# Patient Record
Sex: Female | Born: 1945 | Race: White | Hispanic: No | Marital: Married | Smoking: Former smoker
Health system: Southern US, Community
[De-identification: ages and names within clinical notes are randomized; demographics above are authoritative.]

## PROBLEM LIST (undated history)

## (undated) DIAGNOSIS — M810 Age-related osteoporosis without current pathological fracture: Secondary | ICD-10-CM

## (undated) DIAGNOSIS — E7800 Pure hypercholesterolemia, unspecified: Secondary | ICD-10-CM

## (undated) DIAGNOSIS — U099 Post covid-19 condition, unspecified: Secondary | ICD-10-CM

## (undated) DIAGNOSIS — E039 Hypothyroidism, unspecified: Secondary | ICD-10-CM

## (undated) DIAGNOSIS — J45909 Unspecified asthma, uncomplicated: Secondary | ICD-10-CM

## (undated) DIAGNOSIS — M1990 Unspecified osteoarthritis, unspecified site: Secondary | ICD-10-CM

## (undated) DIAGNOSIS — J449 Chronic obstructive pulmonary disease, unspecified: Secondary | ICD-10-CM

## (undated) DIAGNOSIS — R053 Chronic cough: Secondary | ICD-10-CM

## (undated) DIAGNOSIS — U071 COVID-19: Secondary | ICD-10-CM

## (undated) DIAGNOSIS — S42309A Unspecified fracture of shaft of humerus, unspecified arm, initial encounter for closed fracture: Secondary | ICD-10-CM

## (undated) DIAGNOSIS — E079 Disorder of thyroid, unspecified: Secondary | ICD-10-CM

## (undated) DIAGNOSIS — Z87891 Personal history of nicotine dependence: Secondary | ICD-10-CM

## (undated) DIAGNOSIS — J189 Pneumonia, unspecified organism: Secondary | ICD-10-CM

## (undated) HISTORY — PX: TONSILLECTOMY: SUR1361

## (undated) HISTORY — PX: TUBAL LIGATION: SHX77

## (undated) HISTORY — PX: CATARACT EXTRACTION W/ INTRAOCULAR LENS IMPLANT: SHX1309

## (undated) HISTORY — PX: BACK SURGERY: SHX140

## (undated) HISTORY — PX: APPENDECTOMY: SHX54

---

## 2009-03-22 ENCOUNTER — Ambulatory Visit: Payer: Self-pay | Admitting: Orthopedic Surgery

## 2009-03-31 ENCOUNTER — Ambulatory Visit: Payer: Self-pay | Admitting: Orthopedic Surgery

## 2010-10-10 ENCOUNTER — Ambulatory Visit: Payer: Self-pay | Admitting: Family Medicine

## 2010-10-18 ENCOUNTER — Ambulatory Visit: Payer: Self-pay | Admitting: Family Medicine

## 2013-09-09 ENCOUNTER — Ambulatory Visit: Payer: Self-pay | Admitting: Ophthalmology

## 2013-09-24 ENCOUNTER — Ambulatory Visit: Payer: Self-pay | Admitting: Ophthalmology

## 2014-06-23 ENCOUNTER — Ambulatory Visit: Payer: Self-pay | Admitting: Otolaryngology

## 2014-07-07 ENCOUNTER — Ambulatory Visit: Payer: Self-pay | Admitting: Otolaryngology

## 2014-08-04 ENCOUNTER — Ambulatory Visit: Payer: Self-pay | Admitting: Gastroenterology

## 2014-08-07 LAB — PATHOLOGY REPORT

## 2015-04-02 NOTE — Op Note (Signed)
PATIENT NAME:  Elizabeth Bean, Elizabeth Bean MR#:  [id number] DATE OF BIRTH:  August 24, 1946  DATE OF PROCEDURE:  09/24/2013  PREOPERATIVE DIAGNOSIS: Visually significant cataract of the right eye.   POSTOPERATIVE DIAGNOSIS: Visually significant cataract of the right eye.   OPERATIVE PROCEDURE: Cataract extraction by phacoemulsification with implant of intraocular lens to the right eye.   SURGEON: Galen ManilaWilliam Stefano Trulson, MD  ANESTHESIA:  1. Managed anesthesia care.  2. 50-50 mixture of 0.75% bupivacaine and 4% Xylocaine given as a retrobulbar block.   COMPLICATIONS: None.   TECHNIQUE:  Stop and chop.  DESCRIPTION OF PROCEDURE: The patient was examined and consented for this procedure in the preoperative holding area and then brought back to the Operating Room where the anesthesia team employed managed anesthesia care.  3.5 milliliters of the aforementioned mixture were placed in the right orbit on an Atkinson needle without complication. The right eye was then prepped and draped in the usual sterile ophthalmic fashion. A lid speculum was placed. The side-port blade was used to create a paracentesis and the anterior chamber was filled with viscoelastic. The keratome was used to create a near clear corneal incision. The continuous curvilinear capsulorrhexis was performed with a cystotome followed by the capsulorrhexis forceps. Hydrodissection and hydrodelineation were carried out with BSS on a blunt cannula. The lens was removed in a stop and chop technique. The remaining cortical material was removed with the irrigation-aspiration handpiece. The capsular bag was inflated with viscoelastic and the Tecnis ZCB00 21.5-diopter lens, serial number [id number] was placed in the capsular bag without complication. The remaining viscoelastic was removed from the eye with the irrigation-aspiration handpiece. The wounds were hydrated. The anterior chamber was flushed with Miostat and the eye was inflated to a physiologic pressure.  0.1 mL of cefuroxime concentration 10 mg/mL was placed in the anterior chamber. The wounds were found to be water tight. The eye was dressed with Vigamox followed by Maxitrol ointment and a protective shield was placed. The patient will follow up with me in one day.    ____________________________ Jerilee FieldWilliam L. Alvester Eads, MD wlp:dmm D: 09/24/2013 17:13:27 ET T: 09/24/2013 20:00:57 ET JOB#: [id number]  cc: Lance Galas L. Belissa Kooy, MD, <Dictator> Jerilee FieldWILLIAM L Ishaaq Penna MD ELECTRONICALLY SIGNED 09/29/2013 13:19

## 2015-04-12 ENCOUNTER — Other Ambulatory Visit: Payer: Self-pay | Admitting: Internal Medicine

## 2015-04-12 DIAGNOSIS — Z1231 Encounter for screening mammogram for malignant neoplasm of breast: Secondary | ICD-10-CM

## 2015-04-28 ENCOUNTER — Other Ambulatory Visit: Payer: Self-pay | Admitting: Internal Medicine

## 2015-04-28 ENCOUNTER — Ambulatory Visit
Admission: RE | Admit: 2015-04-28 | Discharge: 2015-04-28 | Disposition: A | Discharge status: Home / Self Care | Payer: Medicare Other | Source: Ambulatory Visit | Attending: Internal Medicine | Admitting: Internal Medicine

## 2015-04-28 DIAGNOSIS — Z1231 Encounter for screening mammogram for malignant neoplasm of breast: Secondary | ICD-10-CM | POA: Diagnosis not present

## 2015-10-14 ENCOUNTER — Other Ambulatory Visit: Payer: Self-pay | Admitting: Internal Medicine

## 2015-10-14 DIAGNOSIS — R109 Unspecified abdominal pain: Secondary | ICD-10-CM

## 2015-10-21 ENCOUNTER — Ambulatory Visit
Admission: RE | Admit: 2015-10-21 | Discharge: 2015-10-21 | Disposition: A | Discharge status: Home / Self Care | Payer: Medicare Other | Source: Ambulatory Visit | Attending: Internal Medicine | Admitting: Internal Medicine

## 2015-10-21 DIAGNOSIS — R109 Unspecified abdominal pain: Secondary | ICD-10-CM | POA: Insufficient documentation

## 2015-10-21 MED ORDER — IOHEXOL 300 MG/ML  SOLN
100.0000 mL | Freq: Once | INTRAMUSCULAR | Status: AC | PRN
  Administered 2015-10-21: 100 mL via INTRAVENOUS

## 2015-10-26 ENCOUNTER — Other Ambulatory Visit: Payer: Self-pay | Admitting: Otolaryngology

## 2015-10-26 DIAGNOSIS — E041 Nontoxic single thyroid nodule: Secondary | ICD-10-CM

## 2015-11-02 ENCOUNTER — Ambulatory Visit
Admission: RE | Admit: 2015-11-02 | Discharge: 2015-11-02 | Disposition: A | Discharge status: Home / Self Care | Payer: Medicare Other | Source: Ambulatory Visit | Attending: Otolaryngology | Admitting: Otolaryngology

## 2015-11-02 DIAGNOSIS — E010 Iodine-deficiency related diffuse (endemic) goiter: Secondary | ICD-10-CM | POA: Insufficient documentation

## 2015-11-02 DIAGNOSIS — E041 Nontoxic single thyroid nodule: Secondary | ICD-10-CM

## 2016-08-09 ENCOUNTER — Emergency Department
Admission: EM | Admit: 2016-08-09 | Discharge: 2016-08-09 | Disposition: A | Discharge status: Home / Self Care | Payer: Medicare Other | Attending: Emergency Medicine | Admitting: Emergency Medicine

## 2016-08-09 ENCOUNTER — Encounter: Payer: Self-pay | Admitting: *Deleted

## 2016-08-09 DIAGNOSIS — M549 Dorsalgia, unspecified: Secondary | ICD-10-CM | POA: Insufficient documentation

## 2016-08-09 DIAGNOSIS — R5383 Other fatigue: Secondary | ICD-10-CM | POA: Insufficient documentation

## 2016-08-09 DIAGNOSIS — G8929 Other chronic pain: Secondary | ICD-10-CM | POA: Diagnosis not present

## 2016-08-09 DIAGNOSIS — M545 Low back pain: Secondary | ICD-10-CM | POA: Diagnosis present

## 2016-08-09 HISTORY — DX: Disorder of thyroid, unspecified: E07.9

## 2016-08-09 MED ORDER — HYDROMORPHONE HCL 1 MG/ML IJ SOLN
1.0000 mg | Freq: Once | INTRAMUSCULAR | Status: AC
  Administered 2016-08-09: 1 mg via INTRAMUSCULAR
  Filled 2016-08-09: qty 1

## 2016-08-09 MED ORDER — HYDROMORPHONE HCL 2 MG PO TABS: 1.0000 mg | ORAL_TABLET | Freq: Two times a day (BID) | ORAL | 0 refills | Status: AC | PRN

## 2016-08-09 MED ORDER — ONDANSETRON 4 MG PO TBDP
4.0000 mg | ORAL_TABLET | Freq: Once | ORAL | Status: AC
  Administered 2016-08-09: 4 mg via ORAL
  Filled 2016-08-09: qty 1
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
## 2016-08-09 NOTE — ED Notes (Signed)
See triage note  States she developed lower back pain in July. Denies any injury at that time  States she went to turn on light and felt a pull in lower back  States she has been to 4 different MD's and had steroid shot  States pain is across entire lower back  Does not radiate into legs states she was placed on baclofen,flexeril mobic and naprosen  Nothing works .swelling of the ankles she is not taking meds at present

## 2016-08-09 NOTE — ED Triage Notes (Signed)
Pt complains of low back pain since July, pt has seen 4 doctors since, pt denies any other symptoms

## 2016-08-10 ENCOUNTER — Other Ambulatory Visit: Payer: Self-pay | Admitting: Orthopedic Surgery

## 2016-08-10 DIAGNOSIS — M545 Low back pain, unspecified: Secondary | ICD-10-CM

## 2016-08-11 ENCOUNTER — Ambulatory Visit
Admission: RE | Admit: 2016-08-11 | Discharge: 2016-08-11 | Disposition: A | Discharge status: Home / Self Care | Payer: Medicare Other | Source: Ambulatory Visit | Attending: Orthopedic Surgery | Admitting: Orthopedic Surgery

## 2016-08-11 DIAGNOSIS — M545 Low back pain, unspecified: Secondary | ICD-10-CM

## 2016-09-15 ENCOUNTER — Other Ambulatory Visit: Payer: Self-pay | Admitting: Orthopaedic Surgery

## 2016-09-15 DIAGNOSIS — M545 Low back pain, unspecified: Secondary | ICD-10-CM

## 2016-09-30 ENCOUNTER — Ambulatory Visit
Admission: RE | Admit: 2016-09-30 | Discharge: 2016-09-30 | Disposition: A | Discharge status: Home / Self Care | Payer: Medicare Other | Source: Ambulatory Visit | Attending: Orthopaedic Surgery | Admitting: Orthopaedic Surgery

## 2016-09-30 DIAGNOSIS — M545 Low back pain, unspecified: Secondary | ICD-10-CM

## 2016-11-30 ENCOUNTER — Other Ambulatory Visit: Payer: Self-pay | Admitting: Otolaryngology

## 2016-11-30 DIAGNOSIS — E041 Nontoxic single thyroid nodule: Secondary | ICD-10-CM

## 2017-01-31 ENCOUNTER — Ambulatory Visit
Admission: RE | Admit: 2017-01-31 | Discharge: 2017-01-31 | Disposition: A | Discharge status: Home / Self Care | Payer: Medicare Other | Source: Ambulatory Visit | Attending: Otolaryngology | Admitting: Otolaryngology

## 2017-01-31 DIAGNOSIS — E041 Nontoxic single thyroid nodule: Secondary | ICD-10-CM | POA: Diagnosis not present

## 2017-04-05 ENCOUNTER — Telehealth: Payer: Self-pay

## 2017-04-05 NOTE — Telephone Encounter (Signed)
Elizabeth Bean.

## 2017-04-05 NOTE — Telephone Encounter (Signed)
Message put in on wrong patient. KJ CMA

## 2017-04-05 NOTE — Telephone Encounter (Signed)
Pt called triage line stating the prescription refill for her Progesterone was not at [facility]. She was wondering if [facility] was waiting for her to call with update on how she is doing on it before refill can be given. She works out of town and needs this today. Pt would like [facility] to call her. CB# [phone]. KJ CMA (AAMA)

## 2017-06-19 ENCOUNTER — Encounter: Payer: Self-pay | Admitting: *Deleted

## 2017-06-26 ENCOUNTER — Encounter: Payer: Self-pay | Admitting: *Deleted

## 2017-06-26 ENCOUNTER — Ambulatory Visit
Admission: RE | Admit: 2017-06-26 | Discharge: 2017-06-26 | Disposition: A | Discharge status: Home / Self Care | Payer: Medicare Other | Source: Ambulatory Visit | Attending: Ophthalmology | Admitting: Ophthalmology

## 2017-06-26 ENCOUNTER — Ambulatory Visit: Payer: Medicare Other | Admitting: Anesthesiology

## 2017-06-26 ENCOUNTER — Encounter
Admission: RE | Disposition: A | Discharge status: Home / Self Care | Payer: Self-pay | Source: Ambulatory Visit | Attending: Ophthalmology

## 2017-06-26 DIAGNOSIS — M479 Spondylosis, unspecified: Secondary | ICD-10-CM | POA: Diagnosis not present

## 2017-06-26 DIAGNOSIS — Z885 Allergy status to narcotic agent status: Secondary | ICD-10-CM | POA: Diagnosis not present

## 2017-06-26 DIAGNOSIS — M810 Age-related osteoporosis without current pathological fracture: Secondary | ICD-10-CM | POA: Insufficient documentation

## 2017-06-26 DIAGNOSIS — H2512 Age-related nuclear cataract, left eye: Secondary | ICD-10-CM | POA: Insufficient documentation

## 2017-06-26 DIAGNOSIS — E7800 Pure hypercholesterolemia, unspecified: Secondary | ICD-10-CM | POA: Diagnosis not present

## 2017-06-26 HISTORY — DX: Pure hypercholesterolemia, unspecified: E78.00

## 2017-06-26 HISTORY — DX: Unspecified osteoarthritis, unspecified site: M19.90

## 2017-06-26 HISTORY — PX: CATARACT EXTRACTION W/PHACO: SHX586

## 2017-06-26 HISTORY — DX: Age-related osteoporosis without current pathological fracture: M81.0

## 2017-06-26 SURGERY — PHACOEMULSIFICATION, CATARACT, WITH IOL INSERTION
Anesthesia: Monitor Anesthesia Care | Site: Eye | Laterality: Left | Wound class: Clean

## 2017-06-26 MED ORDER — LIDOCAINE HCL (PF) 4 % IJ SOLN
INTRAOCULAR | Status: DC | PRN
  Administered 2017-06-26: 2 mL via OPHTHALMIC

## 2017-06-26 MED ORDER — EPINEPHRINE PF 1 MG/ML IJ SOLN
INTRAMUSCULAR | Status: AC
  Filled 2017-06-26: qty 1

## 2017-06-26 MED ORDER — ARMC OPHTHALMIC DILATING DROPS
1.0000 "application " | OPHTHALMIC | Status: AC
  Administered 2017-06-26 (×2): 1 via OPHTHALMIC

## 2017-06-26 MED ORDER — MIDAZOLAM HCL 2 MG/2ML IJ SOLN
INTRAMUSCULAR | Status: AC
  Filled 2017-06-26: qty 2

## 2017-06-26 MED ORDER — SODIUM CHLORIDE 0.9 % IV SOLN
INTRAVENOUS | Status: DC
  Administered 2017-06-26: 11:00:00 via INTRAVENOUS

## 2017-06-26 MED ORDER — LIDOCAINE HCL (PF) 4 % IJ SOLN
INTRAMUSCULAR | Status: AC
  Filled 2017-06-26: qty 5

## 2017-06-26 MED ORDER — POVIDONE-IODINE 5 % OP SOLN
OPHTHALMIC | Status: DC | PRN
  Administered 2017-06-26: 1 via OPHTHALMIC

## 2017-06-26 MED ORDER — MOXIFLOXACIN HCL 0.5 % OP SOLN
OPHTHALMIC | Status: AC
  Filled 2017-06-26: qty 3

## 2017-06-26 MED ORDER — CARBACHOL 0.01 % IO SOLN
INTRAOCULAR | Status: DC | PRN
  Administered 2017-06-26: .5 mL via INTRAOCULAR

## 2017-06-26 MED ORDER — MOXIFLOXACIN HCL 0.5 % OP SOLN: 1.0000 [drp] | OPHTHALMIC | Status: DC | PRN

## 2017-06-26 MED ORDER — NA CHONDROIT SULF-NA HYALURON 40-17 MG/ML IO SOLN
INTRAOCULAR | Status: DC | PRN
  Administered 2017-06-26: 1 mL via INTRAOCULAR

## 2017-06-26 MED ORDER — EPINEPHRINE PF 1 MG/ML IJ SOLN
INTRAOCULAR | Status: DC | PRN
  Administered 2017-06-26: 1 mL via OPHTHALMIC

## 2017-06-26 MED ORDER — MIDAZOLAM HCL 2 MG/2ML IJ SOLN
INTRAMUSCULAR | Status: DC | PRN
  Administered 2017-06-26: 0.5 mg via INTRAVENOUS

## 2017-06-26 MED ORDER — POVIDONE-IODINE 5 % OP SOLN
OPHTHALMIC | Status: AC
  Filled 2017-06-26: qty 30

## 2017-06-26 MED ORDER — NA CHONDROIT SULF-NA HYALURON 40-17 MG/ML IO SOLN
INTRAOCULAR | Status: AC
  Filled 2017-06-26: qty 1

## 2017-06-26 MED ORDER — MOXIFLOXACIN HCL 0.5 % OP SOLN
OPHTHALMIC | Status: DC | PRN
  Administered 2017-06-26: .2 mL via OPHTHALMIC

## 2017-06-26 MED ORDER — ARMC OPHTHALMIC DILATING DROPS
OPHTHALMIC | Status: AC
  Administered 2017-06-26: 11:00:00
  Filled 2017-06-26: qty 0.4

## 2017-06-26 SURGICAL SUPPLY — 16 items
GLOVE BIO SURGEON STRL SZ8 (GLOVE) ×2 IMPLANT
GLOVE BIOGEL M 6.5 STRL (GLOVE) ×2 IMPLANT
GLOVE SURG LX 8.0 MICRO (GLOVE) ×1
GLOVE SURG LX STRL 8.0 MICRO (GLOVE) ×1 IMPLANT
GOWN STRL REUS W/ TWL LRG LVL3 (GOWN DISPOSABLE) ×2 IMPLANT
GOWN STRL REUS W/TWL LRG LVL3 (GOWN DISPOSABLE) ×2
LABEL CATARACT MEDS ST (LABEL) ×2 IMPLANT
LENS IOL TECNIS ITEC 22.5 (Intraocular Lens) ×2 IMPLANT
PACK CATARACT (MISCELLANEOUS) ×2 IMPLANT
PACK CATARACT BRASINGTON LX (MISCELLANEOUS) ×2 IMPLANT
PACK EYE AFTER SURG (MISCELLANEOUS) ×2 IMPLANT
SOL BSS BAG (MISCELLANEOUS) ×2
SOLUTION BSS BAG (MISCELLANEOUS) ×1 IMPLANT
SYR 5ML LL (SYRINGE) ×2 IMPLANT
WATER STERILE IRR 250ML POUR (IV SOLUTION) ×2 IMPLANT
WIPE NON LINTING 3.25X3.25 (MISCELLANEOUS) ×2 IMPLANT

## 2017-06-26 NOTE — Anesthesia Preprocedure Evaluation (Signed)
Anesthesia Evaluation  Patient identified by MRN, date of birth, ID band Patient awake    Reviewed: Allergy & Precautions, H&P , NPO status , Patient's Chart, lab work & pertinent test results, reviewed documented beta blocker date and time   Airway Mallampati: III  TM Distance: <3 FB Neck ROM: limited    Dental  (+) Caps   Pulmonary neg pulmonary ROS,           Cardiovascular Exercise Tolerance: Good negative cardio ROS       Neuro/Psych negative neurological ROS  negative psych ROS   GI/Hepatic negative GI ROS, Neg liver ROS,   Endo/Other  neg diabetesHypothyroidism   Renal/GU negative Renal ROS  negative genitourinary   Musculoskeletal   Abdominal   Peds  Hematology negative hematology ROS (+)   Anesthesia Other Findings Past Medical History: No date: Arthritis No date: Hypercholesteremia No date: Osteoporosis No date: Thyroid disease   Reproductive/Obstetrics negative OB ROS                             Anesthesia Physical Anesthesia Plan  ASA: II  Anesthesia Plan: MAC   Post-op Pain Management:    Induction: Intravenous  PONV Risk Score and Plan:   Airway Management Planned: Natural Airway and Nasal Cannula  Additional Equipment:   Intra-op Plan:   Post-operative Plan:   Informed Consent: I have reviewed the patients History and Physical, chart, labs and discussed the procedure including the risks, benefits and alternatives for the proposed anesthesia with the patient or authorized representative who has indicated his/her understanding and acceptance.   Dental Advisory Given  Plan Discussed with: Anesthesiologist, CRNA and Surgeon  Anesthesia Plan Comments:         Anesthesia Quick Evaluation

## 2017-06-26 NOTE — Anesthesia Post-op Follow-up Note (Cosign Needed)
Anesthesia QCDR form completed.        

## 2017-06-26 NOTE — Discharge Instructions (Signed)
Eye Surgery Discharge Instructions  Expect mild scratchy sensation or mild soreness. DO NOT RUB YOUR EYE!  The day of surgery:  Minimal physical activity, but bed rest is not required  No reading, computer work, or close hand work  No bending, lifting, or straining.  May watch TV  For 24 hours:  No driving, legal decisions, or alcoholic beverages  Safety precautions  Eat anything you prefer: It is better to start with liquids, then soup then solid foods.  _____ Eye patch should be worn until postoperative exam tomorrow.  ____ Solar shield eyeglasses should be worn for comfort in the sunlight/patch while sleeping  Resume all regular medications including aspirin or Coumadin if these were discontinued prior to surgery. You may shower, bathe, shave, or wash your hair. Tylenol may be taken for mild discomfort.  Call your doctor if you experience significant pain, nausea, or vomiting, fever > 101 or other signs of infection. [phone] or [phone] Specific instructions:  Follow-up Information    Galen ManilaPorfilio, William, MD Follow up.   Specialty:  Ophthalmology Why:  July 17 at 3:10pm Contact information: [address] [place] [address] [phone]

## 2017-06-26 NOTE — Anesthesia Postprocedure Evaluation (Signed)
Anesthesia Post Note  Patient: Elizabeth Bean  Procedure(s) Performed: Procedure(s) (LRB): CATARACT EXTRACTION PHACO AND INTRAOCULAR LENS PLACEMENT (IOC) (Left)  Patient location during evaluation: PACU Anesthesia Type: MAC Level of consciousness: awake Pain management: pain level controlled Vital Signs Assessment: post-procedure vital signs reviewed and stable Respiratory status: spontaneous breathing Cardiovascular status: stable Anesthetic complications: no     Last Vitals:  Vitals:   06/26/17 1252 06/26/17 1257  BP: 137/72 105/81  Pulse: 72   Resp: 16   Temp: (!) 36 C     Last Pain:  Vitals:   06/26/17 1048  TempSrc: Tympanic                 VAN STAVEREN,Kessler Solly

## 2017-06-26 NOTE — Transfer of Care (Signed)
Immediate Anesthesia Transfer of Care Note  Patient: Elizabeth Bean  Procedure(s) Performed: Procedure(s) with comments: CATARACT EXTRACTION PHACO AND INTRAOCULAR LENS PLACEMENT (IOC) (Left) - [place] 00:37 AP% 14.8 CDE 5.58 Fluid pack lot # [id number]  Patient Location: PACU and Short Stay  Anesthesia Type:MAC  Level of Consciousness: awake, alert , oriented and patient cooperative  Airway & Oxygen Therapy: Patient Spontanous Breathing  Post-op Assessment: Report given to RN and Post -op Vital signs reviewed and stable  Post vital signs: Reviewed and stable  Last Vitals:  Vitals:   06/19/17 0949 06/26/17 1048  BP: 133/86 (!) 144/74  Pulse: 71 76  Resp:  18  Temp:  (!) 36.2 C    Last Pain:  Vitals:   06/26/17 1048  TempSrc: Tympanic         Complications: No apparent anesthesia complications

## 2017-06-26 NOTE — H&P (Signed)
All labs reviewed. Abnormal studies sent to patients PCP when indicated.  Previous H&P reviewed, patient examined, there are NO CHANGES.  Elizabeth Bean LOUIS7/17/201812:24 PM

## 2017-06-26 NOTE — Op Note (Signed)
PREOPERATIVE DIAGNOSIS:  Nuclear sclerotic cataract of the left eye.   POSTOPERATIVE DIAGNOSIS:  Nuclear sclerotic cataract of the left eye.   OPERATIVE PROCEDURE: Procedure(s): CATARACT EXTRACTION PHACO AND INTRAOCULAR LENS PLACEMENT (IOC)   SURGEON:  Elizabeth ManilaWilliam Ava Tangney, MD.   ANESTHESIA:  Anesthesiologist: Darleene CleaverVan Staveren, Gerrit HeckGijsbertus F, MD CRNA: Charna Busmaniamond, Thomas, CRNA  1.      Managed anesthesia care. 2.     0.271ml of Shugarcaine was instilled following the paracentesis   COMPLICATIONS:  None.   TECHNIQUE:   Stop and chop   DESCRIPTION OF PROCEDURE:  The patient was examined and consented in the preoperative holding area where the aforementioned topical anesthesia was applied to the left eye and then brought back to the Operating Room where the left eye was prepped and draped in the usual sterile ophthalmic fashion and a lid speculum was placed. A paracentesis was created with the side port blade and the anterior chamber was filled with viscoelastic. A near clear corneal incision was performed with the steel keratome. A continuous curvilinear capsulorrhexis was performed with a cystotome followed by the capsulorrhexis forceps. Hydrodissection and hydrodelineation were carried out with BSS on a blunt cannula. The lens was removed in a stop and chop  technique and the remaining cortical material was removed with the irrigation-aspiration handpiece. The capsular bag was inflated with viscoelastic and the Technis ZCB00 lens was placed in the capsular bag without complication. The remaining viscoelastic was removed from the eye with the irrigation-aspiration handpiece. The wounds were hydrated. The anterior chamber was flushed with Miostat and the eye was inflated to physiologic pressure. 0.651ml Vigamox was placed in the anterior chamber. The wounds were found to be water tight. The eye was dressed with Vigamox. The patient was given protective glasses to wear throughout the day and a shield with which to  sleep tonight. The patient was also given drops with which to begin a drop regimen today and will follow-up with me in one day.  Implant Name Type Inv. Item Serial No. Manufacturer Lot No. LRB No. Used  LENS IOL DIOP 22.5 - [id number] Intraocular Lens LENS IOL DIOP 22.5 [id number]   Left 1    Procedure(s) with comments: CATARACT EXTRACTION PHACO AND INTRAOCULAR LENS PLACEMENT (IOC) (Left) - US 00:37 AP% 14.8 CDE 5.58 Fluid pack lot # [id number]  Electronically signed: Jerilynn Feldmeier LOUIS 06/26/2017 1:50 PM

## 2017-06-27 ENCOUNTER — Encounter: Payer: Self-pay | Admitting: Ophthalmology

## 2021-03-07 ENCOUNTER — Other Ambulatory Visit: Payer: Self-pay

## 2021-03-07 ENCOUNTER — Emergency Department: Payer: Medicare HMO

## 2021-03-07 ENCOUNTER — Encounter: Payer: Self-pay | Admitting: Emergency Medicine

## 2021-03-07 DIAGNOSIS — Z79899 Other long term (current) drug therapy: Secondary | ICD-10-CM | POA: Insufficient documentation

## 2021-03-07 DIAGNOSIS — S0990XA Unspecified injury of head, initial encounter: Secondary | ICD-10-CM | POA: Diagnosis not present

## 2021-03-07 DIAGNOSIS — W01198A Fall on same level from slipping, tripping and stumbling with subsequent striking against other object, initial encounter: Secondary | ICD-10-CM | POA: Diagnosis not present

## 2021-03-07 DIAGNOSIS — E039 Hypothyroidism, unspecified: Secondary | ICD-10-CM | POA: Diagnosis not present

## 2021-03-07 DIAGNOSIS — S52502A Unspecified fracture of the lower end of left radius, initial encounter for closed fracture: Secondary | ICD-10-CM | POA: Diagnosis not present

## 2021-03-07 DIAGNOSIS — Y92003 Bedroom of unspecified non-institutional (private) residence as the place of occurrence of the external cause: Secondary | ICD-10-CM | POA: Insufficient documentation

## 2021-03-07 DIAGNOSIS — S6992XA Unspecified injury of left wrist, hand and finger(s), initial encounter: Secondary | ICD-10-CM | POA: Diagnosis present

## 2021-03-07 DIAGNOSIS — S52612A Displaced fracture of left ulna styloid process, initial encounter for closed fracture: Secondary | ICD-10-CM | POA: Diagnosis not present

## 2021-03-07 DIAGNOSIS — Y9301 Activity, walking, marching and hiking: Secondary | ICD-10-CM | POA: Diagnosis not present

## 2021-03-07 NOTE — ED Triage Notes (Signed)
Patient ambulatory to triage with steady gait, without difficulty or distress noted; pt reports PTA was walking in bedroom, lost balance and fell against dresser hitting back of head; c/o pain to area and left wrist pain as well; denies LOC/HA/dizziness

## 2021-03-08 ENCOUNTER — Emergency Department: Payer: Medicare HMO

## 2021-03-08 ENCOUNTER — Emergency Department
Admission: EM | Admit: 2021-03-08 | Discharge: 2021-03-08 | Disposition: A | Discharge status: Home / Self Care | Payer: Medicare HMO | Attending: Emergency Medicine | Admitting: Emergency Medicine

## 2021-03-08 DIAGNOSIS — S62102A Fracture of unspecified carpal bone, left wrist, initial encounter for closed fracture: Secondary | ICD-10-CM

## 2021-03-08 DIAGNOSIS — S0990XA Unspecified injury of head, initial encounter: Secondary | ICD-10-CM

## 2021-03-08 DIAGNOSIS — S52502A Unspecified fracture of the lower end of left radius, initial encounter for closed fracture: Secondary | ICD-10-CM

## 2021-03-08 MED ORDER — DOCUSATE SODIUM 100 MG PO CAPS: 100.0000 mg | ORAL_CAPSULE | Freq: Two times a day (BID) | ORAL | 0 refills | Status: DC

## 2021-03-08 MED ORDER — ONDANSETRON HCL 4 MG/2ML IJ SOLN
4.0000 mg | Freq: Once | INTRAMUSCULAR | Status: AC
  Administered 2021-03-08: 4 mg via INTRAVENOUS
  Filled 2021-03-08 (×2): qty 2

## 2021-03-08 MED ORDER — FENTANYL CITRATE (PF) 100 MCG/2ML IJ SOLN
50.0000 ug | Freq: Once | INTRAMUSCULAR | Status: AC
  Administered 2021-03-08: 50 ug via INTRAVENOUS
  Filled 2021-03-08 (×2): qty 2

## 2021-03-08 MED ORDER — ONDANSETRON 4 MG PO TBDP: 4.0000 mg | ORAL_TABLET | Freq: Four times a day (QID) | ORAL | 0 refills | Status: DC | PRN

## 2021-03-08 MED ORDER — HYDROCODONE-ACETAMINOPHEN 5-325 MG PO TABS
1.0000 | ORAL_TABLET | ORAL | 0 refills | Status: DC | PRN
Start: 2021-03-08 — End: 2021-03-15

## 2021-03-08 MED ORDER — SODIUM CHLORIDE 0.9 % IV SOLN: INTRAVENOUS | Status: DC

## 2021-03-08 MED ORDER — PROPOFOL 10 MG/ML IV BOLUS
0.5000 mg/kg | Freq: Once | INTRAVENOUS | Status: AC
  Administered 2021-03-08: 38.6 mg via INTRAVENOUS
  Filled 2021-03-08 (×2): qty 20

## 2021-03-08 MED ORDER — FENTANYL CITRATE (PF) 100 MCG/2ML IJ SOLN
50.0000 ug | Freq: Once | INTRAMUSCULAR | Status: AC
Start: 2021-03-08 — End: 2021-03-08
  Administered 2021-03-08: 50 ug via INTRAVENOUS
  Filled 2021-03-08: qty 2

## 2021-03-08 NOTE — Sedation Documentation (Signed)
Ortho splint applied to LUE by Seward Meth and Shary Key, RN with Dr Elesa Massed supervising.

## 2021-03-08 NOTE — ED Notes (Signed)
Due to pyxis downtime, all due meds requested from pharmacy.

## 2021-03-08 NOTE — Sedation Documentation (Signed)
XR at bedside

## 2021-03-08 NOTE — Sedation Documentation (Signed)
Ice pack applied to L wrist

## 2021-03-08 NOTE — ED Notes (Signed)
Dr Elesa Massed notified of pt's intermittent hypoxia while on RA. Per Dr Elesa Massed, pt to remain for continued monitoring prior to d/c.

## 2021-03-08 NOTE — ED Provider Notes (Signed)
[facility] Emergency Department Provider Note ____________________________________________   Event Date/Time   First MD Initiated Contact with Patient 03/08/21 [phone]     (approximate)  I have reviewed the triage vital signs and the nursing notes.   HISTORY  Chief Complaint Fall    HPI Elizabeth Bean is a 75 y.o. female with history of hyperlipidemia, hypothyroidism who presents to the emergency department after a fall.  States that she tripped tonight and fell striking her head and injuring her left wrist.  She is right-hand dominant.  No loss of consciousness.  Not on blood thinners.  No neck or back pain.  No numbness or weakness.  Last had anything to eat or drink at 6 PM last night.         Past Medical History:  Diagnosis Date  . Arthritis   . Hypercholesteremia   . Osteoporosis   . Thyroid disease     There are no problems to display for this patient.   Past Surgical History:  Procedure Laterality Date  . APPENDECTOMY    . BACK SURGERY    . CATARACT EXTRACTION W/ INTRAOCULAR LENS IMPLANT    . CATARACT EXTRACTION W/PHACO Left 06/26/2017   Procedure: CATARACT EXTRACTION PHACO AND INTRAOCULAR LENS PLACEMENT (IOC);  Surgeon: Galen Manila, MD;  Location: [facility] ORS;  Service: Ophthalmology;  Laterality: Left;  [place] 00:37 AP% 14.8 CDE 5.58 Fluid pack lot # [id number]  . TONSILLECTOMY    . TUBAL LIGATION      Prior to Admission medications   Medication Sig Start Date End Date Taking? Authorizing Provider  Cholecalciferol (VITAMIN D3) 2000 units TABS Take 1 tablet by mouth daily.   Yes [provider]  docusate sodium (COLACE) 100 MG capsule Take 1 capsule (100 mg total) by mouth 2 (two) times daily. 03/08/21 03/08/22 Yes Dorisann Schwanke, Layla Maw, DO  HYDROcodone-acetaminophen (NORCO/VICODIN) 5-325 MG tablet Take 1 tablet by mouth every 4 (four) hours as needed for moderate pain. 03/08/21 03/08/22 Yes Carlton Sweaney, Layla Maw, DO  levothyroxine  (SYNTHROID) 88 MCG tablet Take 88 mcg by mouth daily.   Yes [provider]  Multiple Vitamin (MULTIVITAMIN) tablet Take 1 tablet by mouth daily.   Yes [provider]  ondansetron (ZOFRAN ODT) 4 MG disintegrating tablet Take 1 tablet (4 mg total) by mouth every 6 (six) hours as needed for nausea or vomiting. 03/08/21  Yes Jessel Gettinger, Baxter Hire N, DO  pravastatin (PRAVACHOL) 40 MG tablet Take 1 tablet by mouth at bedtime.   Yes [provider]  Calcium Carb-Cholecalciferol (CALCIUM 600 + D PO) Take 1 capsule by mouth daily. Patient not taking: Reported on 03/08/2021    [provider]  naproxen sodium (ANAPROX) 220 MG tablet Take 220 mg by mouth 2 (two) times daily with a meal. Patient not taking: Reported on 03/08/2021    [provider]    Allergies Tramadol  No family history on file.  Social History Social History   Tobacco Use  . Smoking status: Never Smoker  . Smokeless tobacco: Never Used  Substance Use Topics  . Alcohol use: No    Review of Systems Constitutional: No fever. Eyes: No visual changes. ENT: No sore throat. Cardiovascular: Denies chest pain. Respiratory: Denies shortness of breath. Gastrointestinal: No nausea, vomiting, diarrhea. Genitourinary: Negative for dysuria. Musculoskeletal: Negative for back pain. Skin: Negative for rash. Neurological: Negative for focal weakness or numbness.   ____________________________________________   PHYSICAL EXAM:  VITAL SIGNS: ED Triage Vitals [03/07/21 2322]  Enc Vitals Group     BP (!) 142/76     Pulse Rate 74     Resp 18     Temp 98 F (36.7 C)     Temp Source Oral     SpO2 96 %     Weight 170 lb (77.1 kg)     Height 5' (1.524 m)     Head Circumference      Peak Flow      Pain Score 9     Pain Loc      Pain Edu?      Excl. in GC?    CONSTITUTIONAL: Alert and oriented and responds appropriately to questions. Well-appearing; well-nourished; GCS 15 HEAD:  Normocephalic; atraumatic EYES: Conjunctivae clear, PERRL, EOMI ENT: normal nose; no rhinorrhea; moist mucous membranes; pharynx without lesions noted; no dental injury; no septal hematoma NECK: Supple, no meningismus, no LAD; no midline spinal tenderness, step-off or deformity; trachea midline CARD: RRR; S1 and S2 appreciated; no murmurs, no clicks, no rubs, no gallops RESP: Normal chest excursion without splinting or tachypnea; breath sounds clear and equal bilaterally; no wheezes, no rhonchi, no rales; no hypoxia or respiratory distress CHEST:  chest wall stable, no crepitus or ecchymosis or deformity, nontender to palpation; no flail chest ABD/GI: Normal bowel sounds; non-distended; soft, non-tender, no rebound, no guarding; no ecchymosis or other lesions noted PELVIS:  stable, nontender to palpation BACK:  The back appears normal and is non-tender to palpation, there is no CVA tenderness; no midline spinal tenderness, step-off or deformity EXT: Deformity noted to the left wrist with soft tissue swelling, ecchymosis and tenderness.  2+ left radial pulse.  Normal sensation throughout the left upper extremity.  Compartments soft.  Otherwise extremities nontender to palpation. SKIN: Normal color for age and race; warm NEURO: Moves all extremities equally, no facial asymmetry, normal speech, normal gait PSYCH: The patient's mood and manner are appropriate. Grooming and personal hygiene are appropriate.  ____________________________________________   LABS (all labs ordered are listed, but only abnormal results are displayed)  Labs Reviewed - No data to display ____________________________________________  EKG  none ____________________________________________  RADIOLOGY I, Emelio Schneller, personally viewed and evaluated these images (plain radiographs) as part of my medical decision making, as well as reviewing the written report by the radiologist.  ED MD interpretation: Comminuted  displaced and angulated distal left radius fracture.  CT head and cervical spine showed no acute abnormality.  Official radiology report(s): DG Wrist 2 Views Left  Result Date: 03/08/2021 CLINICAL DATA:  Post reduction of the left wrist EXAM: LEFT WRIST - 2 VIEW COMPARISON:  Radiography from yesterday FINDINGS: Impacted distal radius fracture with improved dorsal tilting and ulnar positive variance. Located radiocarpal joint. Splint has been applied. IMPRESSION: Impacted distal radius fracture with improved dorsal tilting. Nondisplaced styloid fracture of the ulna. Electronically Signed   By: Marnee SpringJonathon  Watts M.D.   On: 03/08/2021 04:26   DG Wrist Complete Left  Result Date: 03/07/2021 CLINICAL DATA:  Post fall with left wrist pain. Lost balance while walking in bedroom falling again dresser. EXAM: LEFT WRIST - COMPLETE 3+ VIEW COMPARISON:  None. FINDINGS: Comminuted displaced distal radius fracture. There is apex volar angulation. Fracture extends to the distal radioulnar and radiocarpal joints. Distal radiocarpal joint disruption. Mildly displaced ulna styloid fracture. Carpal bones remain aligned with the distal radial fracture fragment. Evidence carpal bone fracture. Soft tissue edema is noted at the fracture site. IMPRESSION: 1. Comminuted displaced and angulated distal radius fracture extending  to the distal radioulnar and radiocarpal joints. Radiocarpal joint disruption. 2. Mildly displaced ulna styloid fracture. Electronically Signed   By: Narda Rutherford M.D.   On: 03/07/2021 23:49   CT Head Wo Contrast  Result Date: 03/07/2021 CLINICAL DATA:  Head trauma. Status post fall hitting back of head on dresser. EXAM: CT HEAD WITHOUT CONTRAST CT CERVICAL SPINE WITHOUT CONTRAST TECHNIQUE: Multidetector CT imaging of the head and cervical spine was performed following the standard protocol without intravenous contrast. Multiplanar CT image reconstructions of the cervical spine were also generated.  COMPARISON:  CT chest 10/18/2010 FINDINGS: CT HEAD FINDINGS Brain: Cerebral ventricle sizes are concordant with the degree of cerebral volume loss. Patchy and confluent areas of decreased attenuation are noted throughout the deep and periventricular white matter of the cerebral hemispheres bilaterally, compatible with chronic microvascular ischemic disease. No evidence of large-territorial acute infarction. No parenchymal hemorrhage. No mass lesion. No extra-axial collection. No mass effect or midline shift. No hydrocephalus. Basilar cisterns are patent. Vascular: No hyperdense vessel. Skull: No acute fracture or focal lesion. Sinuses/Orbits: Paranasal sinuses and mastoid air cells are clear. Bilateral lens replacement. Otherwise the orbits are unremarkable. Other: Mild posterior scalp subcutaneus soft tissue edema. CT CERVICAL SPINE FINDINGS Alignment: Normal. Skull base and vertebrae: Multilevel degenerative changes of the spine. No acute fracture. No aggressive appearing focal osseous lesion or focal pathologic process. Soft tissues and spinal canal: No prevertebral fluid or swelling. No visible canal hematoma. Upper chest: Redemonstration of marked nodular-like biapical pleural/pulmonary scarring. Other: None. IMPRESSION: 1. No acute intracranial abnormality. 2. No acute displaced fracture or traumatic listhesis of the cervical spine. Electronically Signed   By: Tish Frederickson M.D.   On: 03/07/2021 23:59   CT Cervical Spine Wo Contrast  Result Date: 03/07/2021 CLINICAL DATA:  Head trauma. Status post fall hitting back of head on dresser. EXAM: CT HEAD WITHOUT CONTRAST CT CERVICAL SPINE WITHOUT CONTRAST TECHNIQUE: Multidetector CT imaging of the head and cervical spine was performed following the standard protocol without intravenous contrast. Multiplanar CT image reconstructions of the cervical spine were also generated. COMPARISON:  CT chest 10/18/2010 FINDINGS: CT HEAD FINDINGS Brain: Cerebral ventricle  sizes are concordant with the degree of cerebral volume loss. Patchy and confluent areas of decreased attenuation are noted throughout the deep and periventricular white matter of the cerebral hemispheres bilaterally, compatible with chronic microvascular ischemic disease. No evidence of large-territorial acute infarction. No parenchymal hemorrhage. No mass lesion. No extra-axial collection. No mass effect or midline shift. No hydrocephalus. Basilar cisterns are patent. Vascular: No hyperdense vessel. Skull: No acute fracture or focal lesion. Sinuses/Orbits: Paranasal sinuses and mastoid air cells are clear. Bilateral lens replacement. Otherwise the orbits are unremarkable. Other: Mild posterior scalp subcutaneus soft tissue edema. CT CERVICAL SPINE FINDINGS Alignment: Normal. Skull base and vertebrae: Multilevel degenerative changes of the spine. No acute fracture. No aggressive appearing focal osseous lesion or focal pathologic process. Soft tissues and spinal canal: No prevertebral fluid or swelling. No visible canal hematoma. Upper chest: Redemonstration of marked nodular-like biapical pleural/pulmonary scarring. Other: None. IMPRESSION: 1. No acute intracranial abnormality. 2. No acute displaced fracture or traumatic listhesis of the cervical spine. Electronically Signed   By: Tish Frederickson M.D.   On: 03/07/2021 23:59    ____________________________________________   PROCEDURES  Procedure(s) performed (including Critical Care):  .Sedation  Date/Time: 03/08/2021 3:59 AM Performed by: Angelie Kram, Layla Maw, DO Authorized by: Marcena Dias, Layla Maw, DO   Consent:    Consent obtained:  Written  Consent given by:  Patient   Risks discussed:  Allergic reaction, dysrhythmia, inadequate sedation, nausea, vomiting, respiratory compromise necessitating ventilatory assistance and intubation, prolonged sedation necessitating reversal and prolonged hypoxia resulting in organ damage   Alternatives discussed:   Analgesia without sedation Universal protocol:    Procedure explained and questions answered to patient or proxy's satisfaction: yes     Relevant documents present and verified: yes     Test results available: yes     Imaging studies available: yes     Required blood products, implants, devices, and special equipment available: yes     Site/side marked: yes     Immediately prior to procedure, a time out was called: yes     Patient identity confirmed:  Verbally with patient Indications:    Procedure performed:  Fracture reduction   Procedure necessitating sedation performed by:  Physician performing sedation Pre-sedation assessment:    Time since last food or drink:  6pm 03/07/21   ASA classification: class 2 - patient with mild systemic disease     Mouth opening:  3 or more finger widths   Mallampati score:  II - soft palate, uvula, fauces visible   Pre-sedation assessments completed and reviewed: airway patency, cardiovascular function, hydration status, mental status, nausea/vomiting, pain level, respiratory function and temperature     Pre-sedation assessment completed:  03/08/2021 3:30 AM Immediate pre-procedure details:    Reassessment: Patient reassessed immediately prior to procedure     Reviewed: vital signs, relevant labs/tests and NPO status     Verified: bag valve mask available, emergency equipment available, intubation equipment available, IV patency confirmed, oxygen available, reversal medications available and suction available   Procedure details (see MAR for exact dosages):    Preoxygenation:  Nasal cannula   Sedation:  Propofol   Intended level of sedation: moderate (conscious sedation)   Analgesia:  Fentanyl   Intra-procedure monitoring:  Blood pressure monitoring, cardiac monitor, continuous capnometry, continuous pulse oximetry, frequent LOC assessments and frequent vital sign checks   Intra-procedure events: none     Total Provider sedation time (minutes):   10 Post-procedure details:    Post-sedation assessment completed:  03/08/2021 4:22 AM   Attendance: Constant attendance by certified staff until patient recovered     Recovery: Patient returned to pre-procedure baseline     Post-sedation assessments completed and reviewed: airway patency, cardiovascular function, hydration status, mental status, nausea/vomiting, pain level, respiratory function and temperature     Patient is stable for discharge or admission: yes     Procedure completion:  Tolerated well, no immediate complications Reduction of fracture  Date/Time: 03/08/2021 3:59 AM Performed by: Sidharth Leverette, Layla Maw, DO Authorized by: Ferrell Flam, Layla Maw, DO  Consent: Written consent obtained. Risks and benefits: risks, benefits and alternatives were discussed Consent given by: patient Patient understanding: patient states understanding of the procedure being performed Patient consent: the patient's understanding of the procedure matches consent given Procedure consent: procedure consent matches procedure scheduled Relevant documents: relevant documents present and verified Test results: test results available and properly labeled Site marked: the operative site was marked Imaging studies: imaging studies available Required items: required blood products, implants, devices, and special equipment available Patient identity confirmed: verbally with patient Time out: Immediately prior to procedure a "time out" was called to verify the correct patient, procedure, equipment, support staff and site/side marked as required. Preparation: Patient was prepped and draped in the usual sterile fashion. Local anesthesia used: no  Anesthesia: Local anesthesia used: no  Sedation:  Patient sedated: yes Sedation type: moderate (conscious) sedation Sedatives: propofol Analgesia: fentanyl Sedation start date/time: 03/08/2021 3:59 AM Sedation end date/time: 03/08/2021 4:08 AM Vitals: Vital signs were monitored  during sedation.  Patient tolerance: patient tolerated the procedure well with no immediate complications     CRITICAL CARE Performed by: Rochele Raring   Total critical care time: 45 minutes  Critical care time was exclusive of separately billable procedures and treating other patients.  Critical care was necessary to treat or prevent imminent or life-threatening deterioration.  Critical care was time spent personally by me on the following activities: development of treatment plan with patient and/or surrogate as well as nursing, discussions with consultants, evaluation of patient's response to treatment, examination of patient, obtaining history from patient or surrogate, ordering and performing treatments and interventions, ordering and review of laboratory studies, ordering and review of radiographic studies, pulse oximetry and re-evaluation of patient's condition.  SPLINT APPLICATION Date/Time: 5:13 AM Authorized by: Baxter Hire Ruthe Roemer Consent: Verbal consent obtained. Risks and benefits: risks, benefits and alternatives were discussed Consent given by: patient Splint applied by: technician Location details: left wrist Splint type: sugar tong Supplies used: orthoglass Post-procedure: The splinted body part was neurovascularly unchanged following the procedure. Patient tolerance: Patient tolerated the procedure well with no immediate complications.     ____________________________________________   INITIAL IMPRESSION / ASSESSMENT AND PLAN / ED COURSE  As part of my medical decision making, I reviewed the following data within the electronic MEDICAL RECORD NUMBER History obtained from family, Nursing notes reviewed and incorporated, Radiograph reviewed , A consult was requested and obtained from this/these consultant(s) Orthopedics, Notes from prior ED visits and [place] Controlled Substance Database         Patient here with mechanical fall.  CT head and cervical spine obtained in  the waiting room show no acute abnormality.  She does have a distal comminuted, displaced and angulated radius fracture on the left but is neurovascularly intact distally.  Patient will need sedation, reduction and splinting.  ED PROGRESS  Sedation reformed with improvement in alignment.  Continues to be neurovascularly intact.  She is in a sugar tong splint.  We will continue to monitor after sedation and provide pain medicine as needed.  Will discuss with orthopedics.  4:56 AM  Spoke with Dr. Allena Katz orthopedics who has reviewed patient's chart.  He recommends that patient call the office in the morning to schedule an appointment with Dr. Rosita Kea for outpatient treatment.  Patient and husband updated with plan.  Will discharge home.  At this time, I do not feel there is any life-threatening condition present. I have reviewed, interpreted and discussed all results (EKG, imaging, lab, urine as appropriate) and exam findings with patient/family. I have reviewed nursing notes and appropriate previous records.  I feel the patient is safe to be discharged home without further emergent workup and can continue workup as an outpatient as needed. Discussed usual and customary return precautions. Patient/family verbalize understanding and are comfortable with this plan.  Outpatient follow-up has been provided as needed. All questions have been answered.  ____________________________________________   FINAL CLINICAL IMPRESSION(S) / ED DIAGNOSES  Final diagnoses:  Closed fracture of distal end of left radius, unspecified fracture morphology, initial encounter  Injury of head, initial encounter  Left wrist fracture     ED Discharge Orders         Ordered    HYDROcodone-acetaminophen (NORCO/VICODIN) 5-325 MG tablet  Every 4 hours PRN  03/08/21 0513    docusate sodium (COLACE) 100 MG capsule  2 times daily        03/08/21 0513    ondansetron (ZOFRAN ODT) 4 MG disintegrating tablet  Every 6 hours PRN         03/08/21 4967          *Please note:  Lincoln Maxin was evaluated in Emergency Department on 03/08/2021 for the symptoms described in the history of present illness. She was evaluated in the context of the global COVID-19 pandemic, which necessitated consideration that the patient might be at risk for infection with the SARS-CoV-2 virus that causes COVID-19. Institutional protocols and algorithms that pertain to the evaluation of patients at risk for COVID-19 are in a state of rapid change based on information released by regulatory bodies including the CDC and federal and state organizations. These policies and algorithms were followed during the patient's care in the ED.  Some ED evaluations and interventions may be delayed as a result of limited staffing during and the pandemic.*   Note:  This document was prepared using Dragon voice recognition software and may include unintentional dictation errors.   Burnie Therien, Layla Maw, DO 03/08/21 [phone]

## 2021-03-08 NOTE — ED Notes (Signed)
Pt moved to room 19.  Report off to [facility] with pt.

## 2021-03-08 NOTE — ED Notes (Signed)
Pt reports falling in the bedroom tonight striking her head on the dresser.  No loc  No vomiting.  Pt takes no blood thinners.  Pt has hematoma to back of head.  Pt also has pain and swelling to left wrist.  Bruising noted to wrist also.  Deformity noted.  Good distal pulses.  Pt alert  Speech clear.

## 2021-03-08 NOTE — ED Notes (Signed)
Pt repositioned and HOB elevated. Pt provided with crackers and water, husband at bedside assisting with snack. O2 discontinued for anticipated d/c.  Pt c/o splint feeling too tight.  Ice pack and elevation did not improve discomfort.  Cap refill brisk and L fingers are pink. Ace wrap on splint loosened per instruction from Dr. Elesa Massed.

## 2021-03-08 NOTE — Discharge Instructions (Addendum)

## 2021-03-08 NOTE — ED Notes (Signed)
ED Provider at bedside. 

## 2021-03-09 ENCOUNTER — Other Ambulatory Visit: Payer: Self-pay | Admitting: Orthopedic Surgery

## 2021-03-11 ENCOUNTER — Other Ambulatory Visit: Payer: Self-pay

## 2021-03-11 ENCOUNTER — Encounter
Admission: RE | Admit: 2021-03-11 | Discharge: 2021-03-11 | Disposition: A | Discharge status: Home / Self Care | Payer: Medicare HMO | Source: Ambulatory Visit | Attending: Orthopedic Surgery | Admitting: Orthopedic Surgery

## 2021-03-11 DIAGNOSIS — Z20822 Contact with and (suspected) exposure to covid-19: Secondary | ICD-10-CM | POA: Insufficient documentation

## 2021-03-11 DIAGNOSIS — Z01818 Encounter for other preprocedural examination: Secondary | ICD-10-CM | POA: Insufficient documentation

## 2021-03-11 HISTORY — DX: Unspecified asthma, uncomplicated: J45.909

## 2021-03-11 HISTORY — DX: COVID-19: U07.1

## 2021-03-11 HISTORY — DX: Post covid-19 condition, unspecified: U09.9

## 2021-03-11 HISTORY — DX: Hypothyroidism, unspecified: E03.9

## 2021-03-11 NOTE — Patient Instructions (Addendum)
Your procedure is scheduled on:03-15-21 TUESDAY Report to the Registration Desk on the 1st floor of the Medical Mall-Then proceed to the 2nd floor Surgery Desk in the Medical Mall To find out your arrival time, please call [phone] between 1PM - 3PM on:03-14-21 MONDAY  REMEMBER: Instructions that are not followed completely may result in serious medical risk, up to and including death; or upon the discretion of your surgeon and anesthesiologist your surgery may need to be rescheduled.  Do not eat food after midnight the night before surgery.  No gum chewing, lozengers or hard candies.  You may however, drink CLEAR liquids up to 2 hours before you are scheduled to arrive for your surgery. Do not drink anything within 2 hours of your scheduled arrival time.  Clear liquids include: - water  - apple juice without pulp - gatorade  - black coffee or tea (Do NOT add milk or creamers to the coffee or tea) Do NOT drink anything that is not on this list.  In addition, your doctor has ordered for you to drink the provided  Ensure Pre-Surgery Clear Carbohydrate Drink  Drinking this carbohydrate drink up to two hours before surgery helps to reduce insulin resistance and improve patient outcomes. Please complete drinking 2 hours prior to scheduled arrival time the day of surgery.  TAKE THESE MEDICATIONS THE MORNING OF SURGERY WITH A SIP OF WATER: -SYNTHROID (LEVOTHYROXINE) -YOU MAY TAKE HYDROCODONE FOR PAIN IF NEEDED THE MORNING OF SURGERY  One week prior to surgery: Stop Anti-inflammatories (NSAIDS) such as Advil, Aleve, Ibuprofen, Motrin, Naproxen, Naprosyn and Aspirin based products such as Excedrin, Goodys Powder, BC Powder-OK TO TAKE TYLENOL/HYDROCODONE IF NEEDED  Stop ANY OVER THE COUNTER supplements until after surgery-HOWEVER, YOU MAY CONTINUE YOUR MULTIVITAMIN AND VITAMIN D3 UP UNTIL THE DAY PRIOR TO SURGERY  No Alcohol for 24 hours before or after surgery.  No Smoking including  e-cigarettes for 24 hours prior to surgery.  No chewable tobacco products for at least 6 hours prior to surgery.  No nicotine patches on the day of surgery.  Do not use any "recreational" drugs for at least a week prior to your surgery.  Please be advised that the combination of cocaine and anesthesia may have negative outcomes, up to and including death. If you test positive for cocaine, your surgery will be cancelled.  On the morning of surgery brush your teeth with toothpaste and water, you may rinse your mouth with mouthwash if you wish. Do not swallow any toothpaste or mouthwash.  Do not wear jewelry, make-up, hairpins, clips or nail polish.  Do not wear lotions, powders, or perfumes.   Do not shave body from the neck down 48 hours prior to surgery just in case you cut yourself which could leave a site for infection.  Also, freshly shaved skin may become irritated if using the CHG soap.  Contact lenses, hearing aids and dentures may not be worn into surgery.  Do not bring valuables to the hospital. [facility] is not responsible for any missing/lost belongings or valuables.   Use CHG wipes as directed on instruction sheet.  Total Shoulder Arthroplasty:  use Benzolyl Peroxide 5% Gel as directed on instruction sheet.  Fleets enema or Magnesium Citrate as directed.  Bring your C-PAP to the hospital with you in case you may have to spend the night.   Notify your doctor if there is any change in your medical condition (cold, fever, infection).  Wear comfortable clothing (specific to your surgery  type) to the hospital.  Plan for stool softeners for home use; pain medications have a tendency to cause constipation. You can also help prevent constipation by eating foods high in fiber such as fruits and vegetables and drinking plenty of fluids as your diet allows.  After surgery, you can help prevent lung complications by doing breathing exercises.  Take deep breaths and cough every  1-2 hours. Your doctor may order a device called an Incentive Spirometer to help you take deep breaths. When coughing or sneezing, hold a pillow firmly against your incision with both hands. This is called "splinting." Doing this helps protect your incision. It also decreases belly discomfort.  If you are being admitted to the hospital overnight, leave your suitcase in the car. After surgery it may be brought to your room.  If you are being discharged the day of surgery, you will not be allowed to drive home. You will need a responsible adult (18 years or older) to drive you home and stay with you that night.   If you are taking public transportation, you will need to have a responsible adult (18 years or older) with you. Please confirm with your physician that it is acceptable to use public transportation.   Please call the Pre-admissions Testing Dept. at [phone] if you have any questions about these instructions.  Surgery Visitation Policy:  Patients undergoing a surgery or procedure may have one family member or support person with them as long as that person is not COVID-19 positive or experiencing its symptoms.  That person may remain in the waiting area during the procedure.  Inpatient Visitation:    Visiting hours are 7 a.m. to 8 p.m. Inpatients will be allowed two visitors daily. The visitors may change each day during the patient's stay. No visitors under the age of 76. Any visitor under the age of 63 must be accompanied by an adult. The visitor must pass COVID-19 screenings, use hand sanitizer when entering and exiting the patient's room and wear a mask at all times, including in the patient's room. Patients must also wear a mask when staff or their visitor are in the room. Masking is required regardless of vaccination status.

## 2021-03-14 ENCOUNTER — Encounter
Admission: RE | Admit: 2021-03-14 | Discharge: 2021-03-14 | Disposition: A | Discharge status: Home / Self Care | Payer: Medicare HMO | Source: Ambulatory Visit | Attending: Orthopedic Surgery | Admitting: Orthopedic Surgery

## 2021-03-14 ENCOUNTER — Other Ambulatory Visit: Payer: Self-pay

## 2021-03-14 ENCOUNTER — Other Ambulatory Visit: Payer: Medicare HMO

## 2021-03-14 DIAGNOSIS — I444 Left anterior fascicular block: Secondary | ICD-10-CM | POA: Insufficient documentation

## 2021-03-14 DIAGNOSIS — Z01818 Encounter for other preprocedural examination: Secondary | ICD-10-CM | POA: Insufficient documentation

## 2021-03-14 DIAGNOSIS — Z20822 Contact with and (suspected) exposure to covid-19: Secondary | ICD-10-CM | POA: Insufficient documentation

## 2021-03-14 LAB — SARS CORONAVIRUS 2 (TAT 6-24 HRS): SARS Coronavirus 2: NEGATIVE

## 2021-03-15 ENCOUNTER — Ambulatory Visit: Payer: Medicare HMO

## 2021-03-15 ENCOUNTER — Encounter
Admission: RE | Disposition: A | Discharge status: Home / Self Care | Payer: Self-pay | Source: Home / Self Care | Attending: Orthopedic Surgery

## 2021-03-15 ENCOUNTER — Other Ambulatory Visit: Payer: Self-pay

## 2021-03-15 ENCOUNTER — Encounter: Payer: Self-pay | Admitting: Orthopedic Surgery

## 2021-03-15 ENCOUNTER — Ambulatory Visit
Admission: RE | Admit: 2021-03-15 | Discharge: 2021-03-15 | Disposition: A | Discharge status: Home / Self Care | Payer: Medicare HMO | Attending: Orthopedic Surgery | Admitting: Orthopedic Surgery

## 2021-03-15 ENCOUNTER — Ambulatory Visit: Payer: Medicare HMO | Admitting: Anesthesiology

## 2021-03-15 DIAGNOSIS — Z7951 Long term (current) use of inhaled steroids: Secondary | ICD-10-CM | POA: Insufficient documentation

## 2021-03-15 DIAGNOSIS — Z820 Family history of epilepsy and other diseases of the nervous system: Secondary | ICD-10-CM | POA: Insufficient documentation

## 2021-03-15 DIAGNOSIS — Z800 Family history of malignant neoplasm of digestive organs: Secondary | ICD-10-CM | POA: Insufficient documentation

## 2021-03-15 DIAGNOSIS — S52572A Other intraarticular fracture of lower end of left radius, initial encounter for closed fracture: Secondary | ICD-10-CM | POA: Insufficient documentation

## 2021-03-15 DIAGNOSIS — Z9889 Other specified postprocedural states: Secondary | ICD-10-CM

## 2021-03-15 DIAGNOSIS — Y9389 Activity, other specified: Secondary | ICD-10-CM | POA: Insufficient documentation

## 2021-03-15 DIAGNOSIS — Z801 Family history of malignant neoplasm of trachea, bronchus and lung: Secondary | ICD-10-CM | POA: Diagnosis not present

## 2021-03-15 DIAGNOSIS — Z888 Allergy status to other drugs, medicaments and biological substances status: Secondary | ICD-10-CM | POA: Diagnosis not present

## 2021-03-15 DIAGNOSIS — W010XXA Fall on same level from slipping, tripping and stumbling without subsequent striking against object, initial encounter: Secondary | ICD-10-CM | POA: Diagnosis not present

## 2021-03-15 DIAGNOSIS — Z79899 Other long term (current) drug therapy: Secondary | ICD-10-CM | POA: Diagnosis not present

## 2021-03-15 DIAGNOSIS — Z8781 Personal history of (healed) traumatic fracture: Secondary | ICD-10-CM

## 2021-03-15 HISTORY — PX: OPEN REDUCTION INTERNAL FIXATION (ORIF) DISTAL RADIAL FRACTURE: SHX5989

## 2021-03-15 SURGERY — OPEN REDUCTION INTERNAL FIXATION (ORIF) DISTAL RADIUS FRACTURE
Anesthesia: General | Laterality: Left

## 2021-03-15 MED ORDER — PROPOFOL 10 MG/ML IV BOLUS
INTRAVENOUS | Status: AC
  Filled 2021-03-15: qty 20

## 2021-03-15 MED ORDER — FENTANYL CITRATE (PF) 100 MCG/2ML IJ SOLN
INTRAMUSCULAR | Status: AC
  Administered 2021-03-15: 25 ug via INTRAVENOUS
  Filled 2021-03-15: qty 2

## 2021-03-15 MED ORDER — ONDANSETRON HCL 4 MG/2ML IJ SOLN
INTRAMUSCULAR | Status: AC
  Filled 2021-03-15: qty 2

## 2021-03-15 MED ORDER — HYDROMORPHONE HCL 1 MG/ML IJ SOLN
INTRAMUSCULAR | Status: AC
  Administered 2021-03-15: 0.25 mg via INTRAVENOUS
  Filled 2021-03-15: qty 0.5

## 2021-03-15 MED ORDER — KETOROLAC TROMETHAMINE 30 MG/ML IJ SOLN
INTRAMUSCULAR | Status: AC
  Filled 2021-03-15: qty 1

## 2021-03-15 MED ORDER — CHLORHEXIDINE GLUCONATE 0.12 % MT SOLN
OROMUCOSAL | Status: AC
  Filled 2021-03-15: qty 15

## 2021-03-15 MED ORDER — HYDROCODONE-ACETAMINOPHEN 5-325 MG PO TABS
1.0000 | ORAL_TABLET | Freq: Once | ORAL | Status: AC
Start: 2021-03-15 — End: 2021-03-15
  Administered 2021-03-15: 1 via ORAL

## 2021-03-15 MED ORDER — LIDOCAINE HCL (PF) 2 % IJ SOLN
INTRAMUSCULAR | Status: AC
  Filled 2021-03-15: qty 5

## 2021-03-15 MED ORDER — CEFAZOLIN SODIUM-DEXTROSE 2-4 GM/100ML-% IV SOLN
INTRAVENOUS | Status: AC
  Filled 2021-03-15: qty 100

## 2021-03-15 MED ORDER — FENTANYL CITRATE (PF) 100 MCG/2ML IJ SOLN
25.0000 ug | INTRAMUSCULAR | Status: DC | PRN
  Administered 2021-03-15 (×3): 25 ug via INTRAVENOUS

## 2021-03-15 MED ORDER — KETOROLAC TROMETHAMINE 30 MG/ML IJ SOLN
30.0000 mg | Freq: Once | INTRAMUSCULAR | Status: AC
  Administered 2021-03-15: 30 mg via INTRAVENOUS

## 2021-03-15 MED ORDER — HYDROCODONE-ACETAMINOPHEN 5-325 MG PO TABS
ORAL_TABLET | ORAL | Status: AC
  Filled 2021-03-15: qty 1

## 2021-03-15 MED ORDER — HYDROMORPHONE HCL 1 MG/ML IJ SOLN
0.2500 mg | INTRAMUSCULAR | Status: AC | PRN
Start: 2021-03-15 — End: 2021-03-15
  Administered 2021-03-15 (×3): 0.25 mg via INTRAVENOUS

## 2021-03-15 MED ORDER — FENTANYL CITRATE (PF) 100 MCG/2ML IJ SOLN
INTRAMUSCULAR | Status: AC
  Filled 2021-03-15: qty 2

## 2021-03-15 MED ORDER — NEOMYCIN-POLYMYXIN B GU 40-200000 IR SOLN
Status: DC | PRN
  Administered 2021-03-15: 2 mL

## 2021-03-15 MED ORDER — HYDROCODONE-ACETAMINOPHEN 5-325 MG PO TABS: 1.0000 | ORAL_TABLET | ORAL | 0 refills | Status: DC | PRN

## 2021-03-15 MED ORDER — HYDROMORPHONE HCL 1 MG/ML IJ SOLN
INTRAMUSCULAR | Status: AC
  Administered 2021-03-15: 0.25 mg via INTRAVENOUS
  Filled 2021-03-15: qty 1

## 2021-03-15 MED ORDER — CEFAZOLIN SODIUM-DEXTROSE 2-4 GM/100ML-% IV SOLN
2.0000 g | INTRAVENOUS | Status: AC
  Administered 2021-03-15: 2 g via INTRAVENOUS

## 2021-03-15 MED ORDER — ACETAMINOPHEN 10 MG/ML IV SOLN
INTRAVENOUS | Status: DC | PRN
  Administered 2021-03-15: 1000 mg via INTRAVENOUS

## 2021-03-15 MED ORDER — ONDANSETRON HCL 4 MG/2ML IJ SOLN
INTRAMUSCULAR | Status: DC | PRN
  Administered 2021-03-15: 4 mg via INTRAVENOUS

## 2021-03-15 MED ORDER — HYDROMORPHONE HCL 1 MG/ML IJ SOLN
0.2500 mg | INTRAMUSCULAR | Status: DC | PRN
Start: 2021-03-15 — End: 2021-03-16
  Administered 2021-03-15: 0.25 mg via INTRAVENOUS

## 2021-03-15 MED ORDER — NEOMYCIN-POLYMYXIN B GU 40-200000 IR SOLN
Status: AC
  Filled 2021-03-15: qty 2

## 2021-03-15 MED ORDER — DEXAMETHASONE SODIUM PHOSPHATE 10 MG/ML IJ SOLN
INTRAMUSCULAR | Status: AC
  Filled 2021-03-15: qty 1

## 2021-03-15 MED ORDER — FAMOTIDINE 20 MG PO TABS
20.0000 mg | ORAL_TABLET | Freq: Once | ORAL | Status: AC
  Administered 2021-03-15: 20 mg via ORAL

## 2021-03-15 MED ORDER — ONDANSETRON HCL 4 MG/2ML IJ SOLN: 4.0000 mg | Freq: Once | INTRAMUSCULAR | Status: DC | PRN

## 2021-03-15 MED ORDER — PROPOFOL 10 MG/ML IV BOLUS
INTRAVENOUS | Status: DC | PRN
  Administered 2021-03-15: 90 mg via INTRAVENOUS

## 2021-03-15 MED ORDER — DEXAMETHASONE SODIUM PHOSPHATE 10 MG/ML IJ SOLN
INTRAMUSCULAR | Status: DC | PRN
  Administered 2021-03-15: 4 mg via INTRAVENOUS

## 2021-03-15 MED ORDER — HYDROMORPHONE HCL 1 MG/ML IJ SOLN
0.2500 mg | Freq: Once | INTRAMUSCULAR | Status: AC
  Administered 2021-03-15: 0.25 mg via INTRAVENOUS

## 2021-03-15 MED ORDER — HYDROMORPHONE HCL 1 MG/ML IJ SOLN: 0.2500 mg | Freq: Once | INTRAMUSCULAR | Status: AC

## 2021-03-15 MED ORDER — LACTATED RINGERS IV SOLN: INTRAVENOUS | Status: DC

## 2021-03-15 MED ORDER — CHLORHEXIDINE GLUCONATE 0.12 % MT SOLN
15.0000 mL | Freq: Once | OROMUCOSAL | Status: AC
  Administered 2021-03-15: 15 mL via OROMUCOSAL

## 2021-03-15 MED ORDER — LIDOCAINE HCL (CARDIAC) PF 100 MG/5ML IV SOSY
PREFILLED_SYRINGE | INTRAVENOUS | Status: DC | PRN
  Administered 2021-03-15: 80 mg via INTRAVENOUS

## 2021-03-15 MED ORDER — ORAL CARE MOUTH RINSE: 15.0000 mL | Freq: Once | OROMUCOSAL | Status: AC

## 2021-03-15 MED ORDER — FENTANYL CITRATE (PF) 100 MCG/2ML IJ SOLN
INTRAMUSCULAR | Status: DC | PRN
  Administered 2021-03-15 (×2): 25 ug via INTRAVENOUS

## 2021-03-15 MED ORDER — FAMOTIDINE 20 MG PO TABS
ORAL_TABLET | ORAL | Status: AC
  Filled 2021-03-15: qty 1

## 2021-03-15 MED ORDER — ACETAMINOPHEN 10 MG/ML IV SOLN
INTRAVENOUS | Status: AC
  Filled 2021-03-15: qty 100

## 2021-03-15 SURGICAL SUPPLY — 38 items
APL PRP STRL LF DISP 70% ISPRP (MISCELLANEOUS) ×1
BNDG ELASTIC 4X5.8 VLCR STR LF (GAUZE/BANDAGES/DRESSINGS) ×2 IMPLANT
CHLORAPREP W/TINT 26 (MISCELLANEOUS) ×2 IMPLANT
COVER WAND RF STERILE (DRAPES) ×2 IMPLANT
CUFF TOURN SGL QUICK 18X4 (TOURNIQUET CUFF) ×1 IMPLANT
DRAPE FLUOR MINI C-ARM 54X84 (DRAPES) ×2 IMPLANT
ELECT REM PT RETURN 9FT ADLT (ELECTROSURGICAL) ×2
ELECTRODE REM PT RTRN 9FT ADLT (ELECTROSURGICAL) ×1 IMPLANT
GAUZE SPONGE 4X4 12PLY STRL (GAUZE/BANDAGES/DRESSINGS) ×2 IMPLANT
GAUZE XEROFORM 1X8 LF (GAUZE/BANDAGES/DRESSINGS) ×2 IMPLANT
GLOVE SURG SYN 9.0  PF PI (GLOVE) ×1
GLOVE SURG SYN 9.0 PF PI (GLOVE) ×1 IMPLANT
GOWN SRG 2XL LVL 4 RGLN SLV (GOWNS) ×1 IMPLANT
GOWN STRL NON-REIN 2XL LVL4 (GOWNS) ×2
GOWN STRL REUS W/ TWL LRG LVL3 (GOWN DISPOSABLE) ×1 IMPLANT
GOWN STRL REUS W/TWL LRG LVL3 (GOWN DISPOSABLE) ×2
KIT TURNOVER KIT A (KITS) ×2 IMPLANT
MANIFOLD NEPTUNE II (INSTRUMENTS) ×2 IMPLANT
NDL FILTER BLUNT 18X1 1/2 (NEEDLE) ×1 IMPLANT
NEEDLE FILTER BLUNT 18X 1/2SAF (NEEDLE) ×1
NEEDLE FILTER BLUNT 18X1 1/2 (NEEDLE) ×1 IMPLANT
NS IRRIG 500ML POUR BTL (IV SOLUTION) ×2 IMPLANT
PACK EXTREMITY ARMC (MISCELLANEOUS) ×2 IMPLANT
PAD CAST CTTN 4X4 STRL (SOFTGOODS) ×2 IMPLANT
PADDING CAST COTTON 4X4 STRL (SOFTGOODS)
PEG SUBCHONDRAL SMOOTH 2.0X14 (Peg) ×1 IMPLANT
PEG SUBCHONDRAL SMOOTH 2.0X22 (Peg) ×2 IMPLANT
PLATE SHORT 21.6X48.9 NRRW LT (Plate) ×1 IMPLANT
SCALPEL PROTECTED #15 DISP (BLADE) ×4 IMPLANT
SCREW MULTI DIRECT 18MM (Screw) ×1 IMPLANT
SCREW MULTI DIRECT 20MM (Screw) ×3 IMPLANT
SLING ARM M TX990204 (SOFTGOODS) ×1 IMPLANT
SPLINT CAST 1 STEP 3X12 (MISCELLANEOUS) ×1 IMPLANT
SUT ETHILON 4-0 (SUTURE) ×2
SUT ETHILON 4-0 FS2 18XMFL BLK (SUTURE) ×1
SUT VICRYL 3-0 27IN (SUTURE) ×2 IMPLANT
SUTURE ETHLN 4-0 FS2 18XMF BLK (SUTURE) ×1 IMPLANT
SYR 3ML LL SCALE MARK (SYRINGE) ×2 IMPLANT

## 2021-03-15 NOTE — Op Note (Signed)
03/15/2021  5:50 PM  PATIENT:  Elizabeth Bean  75 y.o. female  PRE-OPERATIVE DIAGNOSIS:  Other closed intra-articular fracture of distal end of left radius, initial encounter S52.572A  POST-OPERATIVE DIAGNOSIS:  Other closed intra-articular fracture of distal end of left radius, initial encounter S52.572A  PROCEDURE:  Procedure(s): Left distal radius open reduction internal fixation (Left)  SURGEON: Leitha Schuller, MD  ASSISTANTS: None  ANESTHESIA:   general  EBL:  Total I/O In: 900 [I.V.:700; IV Piggyback:200] Out: 2 [Blood:2]  BLOOD ADMINISTERED:none  DRAINS: none   LOCAL MEDICATIONS USED:  NONE  SPECIMEN:  No Specimen  DISPOSITION OF SPECIMEN:  N/A  COUNTS:  YES  TOURNIQUET:   Total Tourniquet Time Documented: Upper Arm (Left) - 24 minutes Total: Upper Arm (Left) - 24 minutes   IMPLANTS: [employer] short narrow DVR plate left with multiple smooth pegs multidirectional screws and cortical screws  DICTATION: .Dragon Dictation patient was brought to the operating room and after adequate anesthesia was obtained wedding ring and engagement removed from the left ring finger without too much difficulty.  The arm was then prepped and draped in usual sterile fashion with appropriate patient identification timeout procedure carried out.  Tourniquet was raised a volar approach was made centered over the FCR tendon after fingertrap traction was applied over the end of the bed with 10 pounds of traction.  The FCR tendon sheath was incised the tendon retracted radially to protect the artery and veins deep fascia incised and deep retractor placed.  The pronator was elevated off the proximal shaft it was already stripped off the distal fragment.  With traction having been applied length was restored but there was still a great deal of dorsal tilt and the fragments were very distal.  A K wire was used to provisionally hold the plate in position followed by placement of a  few smooth pegs.  Some of these did not really get a good fixation so they were switched to the multidirectional screws and these were also used in the proximal row.  After having filled all the distal screw holes and be sure that none were not penetrating into the joint the plate was brought to the shaft under fluoroscopic evaluation and the distal fragment was restored to near anatomic volar tilt and radial inclination.  There were 2 large intra-articular fragments and they reduced well with this maneuver.  3 cortical screws were placed and the traction removed.  With me motion under fluoroscopy this fracture was stable.  The tourniquet was let down.  Wound irrigated and closed with 3-0 Vicryl subcutaneously 4-0 nylon for the skin followed by Xeroform 4 x 4 web roll volar splint and Ace wrap  PLAN OF CARE: Discharge to home after PACU  PATIENT DISPOSITION:  PACU - hemodynamically stable.

## 2021-03-15 NOTE — Anesthesia Procedure Notes (Signed)
Procedure Name: LMA Insertion Date/Time: 03/15/2021 5:00 PM Performed by: Henrietta Hoover, CRNA Pre-anesthesia Checklist: Emergency Drugs available, Patient identified, Suction available and Patient being monitored Patient Re-evaluated:Patient Re-evaluated prior to induction Oxygen Delivery Method: Circle system utilized Preoxygenation: Pre-oxygenation with 100% oxygen Induction Type: IV induction Ventilation: Mask ventilation without difficulty LMA: LMA inserted LMA Size: 3.5 Number of attempts: 1 Placement Confirmation: positive ETCO2 and breath sounds checked- equal and bilateral Tube secured with: Tape Dental Injury: Teeth and Oropharynx as per pre-operative assessment

## 2021-03-15 NOTE — H&P (Signed)
Chief Complaint: Chief Complaint  Patient presents with  . Left Wrist Fracture  Larey Seat on 03/07/2021   Elizabeth Bean is a 75 y.o. female who presents today for evaluation of a left wrist injury sustained on 03/07/2021. The patient states that she was closing her blinds when she slipped and fell and tried to reach out and catch her self with her left hand. The patient landed directly onto her left wrist felt a pop and had immediate pain. The patient went to the emergency room where x-rays demonstrated a comminuted, displaced and dorsally angulated distal radius fracture extending into the distal radial ulnar joint. Also demonstrate a displaced ulnar styloid fracture. A closed reduction was performed which did improve the alignment but the patient still has a comminuted distal radius fracture with dorsal angulation. She was placed in a sugar tong splint and instructed to follow-up orthopedics. The patient is right-hand dominant. She does still perform all routine activities on her own in is very active at her house. The patient denies any numbness or tingling to the left upper extremity. The patient denies any previous injury or trauma to the left wrist. No previous surgical history to the left hand or wrist. The patient is taking hydrocodone as needed for discomfort. The patient denies any personal history of heart attack, stroke, asthma or COPD. No personal history of blood clots.  Past Medical History: Past Medical History:  Diagnosis Date  . Chicken pox  . Fractures, stress  foot  . Osteopenia  . Shingles  . Thyroid disease   Past Surgical History: Past Surgical History:  Procedure Laterality Date  . APPENDECTOMY  . EGD 08/04/2014  Intestinal Metaplasia & Esophagitis - repeat 1 year per Dr. Shelle Iron  . TONSILLECTOMY   Past Family History: Family History  Problem Relation Age of Onset  . Lung cancer Father  . Throat cancer Other  . Alzheimer's disease Mother  . Throat cancer Brother  .  Colon cancer Neg Hx  . Colon polyps Neg Hx  . Inflammatory bowel disease Neg Hx   Medications: Current Outpatient Medications Ordered in Epic  Medication Sig Dispense Refill  . cholecalciferol (VITAMIN D3) 2,000 unit capsule Take 2,000 Units by mouth once daily.  . EUTHYROX 88 mcg tablet TAKE 1 TABLET BY MOUTH ONCE DAILY ON AN EMPTY STOMACH WITH A GLASS OF WATER AT LEAST 30 TO 60 MINUTES BEFORE BREAKFAST 90 tablet 0  . fluticasone propionate (FLONASE) 50 mcg/actuation nasal spray Place 2 sprays into both nostrils once daily 16 g 11  . HYDROcodone-acetaminophen (NORCO) 5-325 mg tablet Take 1 tablet by mouth every 4 (four) hours as needed for Pain 30 tablet 0  . multivitamin tablet Take 1 tablet by mouth once daily  . pravastatin (PRAVACHOL) 40 MG tablet Take 1 tablet (40 mg total) by mouth nightly 90 tablet 1  . fluticasone propion-salmeteroL (ADVAIR DISKUS) 250-50 mcg/dose diskus inhaler Inhale 1 inhalation into the lungs every 12 (twelve) hours for 30 days 1 each 2   No current Epic-ordered facility-administered medications on file.   Allergies: Allergies  Allergen Reactions  . Tramadol Itching    Review of Systems:  A comprehensive 14 point ROS was performed, reviewed by me today, and the pertinent orthopaedic findings are documented in the HPI.  Exam: BP 136/84  Ht 152.4 cm (5')  Wt 81.6 kg (180 lb)  BMI 35.15 kg/m  General/Constitutional: The patient appears to be well-nourished, well-developed, and in no acute distress. Neuro/Psych: Normal mood and affect, oriented  to person, place and time. Eyes: Non-icteric. Pupils are equal, round, and reactive to light, and exhibit synchronous movement. ENT: Unremarkable. Lymphatic: No palpable adenopathy. Respiratory: Lungs clear to auscultation, Normal chest excursion, No wheezes and Non-labored breathing Cardiovascular: Regular rate and rhythm. No murmurs. and No edema, swelling or tenderness, except as noted in detailed  exam. Integumentary: No impressive skin lesions present, except as noted in detailed exam. Musculoskeletal: Unremarkable, except as noted in detailed exam.  She presents today in a wheelchair. Sugar tong splint is intact to left upper extremity. The splint was loosened to examine the skin, skin examination left hand demonstrates moderate swelling, mild ecchymosis along the dorsal and volar aspect of the wrist but no open wound or signs of infection. The patient is able to flex and extend all digits without significant discomfort. Cap refills intact to each individual digit. The patient is intact light touch over the dorsal and volar aspect of fingers in addition to the proximal aspect of the splint. The splint was rewrapped prior to the patient leaving today's visit. Splint reapplied to the left upper extremity.  Imaging: X-rays obtained at the emergency room are detailed in the HPI above.  Impression: Other closed intra-articular fracture of distal end of left radius, initial encounter [S52.572A] Other closed intra-articular fracture of distal end of left radius, initial encounter (primary encounter diagnosis) Closed displaced fracture of styloid process of left ulna, initial encounter  Plan:  1. Treatment options were discussed today with the patient. 2. After a detailed discussion of the risk and benefits of both surgical and nonsurgical treatment options, the patient would like to proceed with surgical intervention. 3. The patient will be scheduled for a left distal radius open reduction internal fixation with Dr. Rosita Kea. Surgery will be scheduled on 03/15/2021. 4. This document will serve as a surgical history and physical for the patient. Risk and benefits of surgery were discussed in detail. 5. The patient will follow-up 3 days after surgery for repeat skin check. They can call the clinic they have any questions, new symptoms develop or symptoms worsen.  The procedure was discussed with the  patient, as were the potential risks (including bleeding, infection, nerve and/or blood vessel injury, persistent or recurrent pain, failure of the reduction, progression of arthritis, need for further surgery, blood clots, strokes, heart attacks and/or arhythmias, pneumonia, etc.) and benefits. The patient states her understanding and wishes to proceed.  This office visit took 45 minutes, of which >50% involved patient counseling/education.  Review of the [place] CSRS was performed in accordance of the [facility] prior to dispensing any controlled drugs.  This note was generated in part with voice recognition software and I apologize for any typographical errors that were not detected and corrected.  Valeria Batman, PA-C [facility]  Electronically signed by Anson [place], PA at 03/10/2021 9:24 PM EDT     Reviewed  H+P. No changes noted.

## 2021-03-15 NOTE — Transfer of Care (Signed)
Immediate Anesthesia Transfer of Care Note  Patient: Elizabeth Bean  Procedure(s) Performed: Left distal radius open reduction internal fixation (Left )  Patient Location: PACU  Anesthesia Type:General  Level of Consciousness: awake, drowsy and patient cooperative  Airway & Oxygen Therapy: Patient Spontanous Breathing and Patient connected to face mask oxygen  Post-op Assessment: Report given to RN and Post -op Vital signs reviewed and stable  Post vital signs: Reviewed and stable  Last Vitals:  Vitals Value Taken Time  BP 125/99 03/15/21 1754  Temp    Pulse 70 03/15/21 1757  Resp 23 03/15/21 1757  SpO2 100 % 03/15/21 1757  Vitals shown include unvalidated device data.  Last Pain:  Vitals:   03/15/21 1509  TempSrc:   PainSc: 7          Complications: No complications documented.

## 2021-03-15 NOTE — Anesthesia Preprocedure Evaluation (Signed)
Anesthesia Evaluation  Patient identified by MRN, date of birth, ID band Patient awake    Reviewed: Allergy & Precautions, H&P , NPO status , Patient's Chart, lab work & pertinent test results, reviewed documented beta blocker date and time   History of Anesthesia Complications Negative for: history of anesthetic complications  Airway Mallampati: III  TM Distance: <3 FB Neck ROM: limited    Dental  (+) Caps, Dental Advidsory Given   Pulmonary neg pulmonary ROS, former smoker,           Cardiovascular Exercise Tolerance: Good negative cardio ROS       Neuro/Psych negative neurological ROS  negative psych ROS   GI/Hepatic negative GI ROS, Neg liver ROS,   Endo/Other  neg diabetesHypothyroidism   Renal/GU negative Renal ROS  negative genitourinary   Musculoskeletal   Abdominal   Peds  Hematology negative hematology ROS (+)   Anesthesia Other Findings Past Medical History: No date: Arthritis No date: Hypercholesteremia No date: Osteoporosis No date: Thyroid disease   Reproductive/Obstetrics negative OB ROS                             Anesthesia Physical  Anesthesia Plan  ASA: II  Anesthesia Plan: General   Post-op Pain Management:    Induction: Intravenous  PONV Risk Score and Plan: 3 and Ondansetron, Dexamethasone and Treatment may vary due to age or medical condition  Airway Management Planned: LMA  Additional Equipment:   Intra-op Plan:   Post-operative Plan: Extubation in OR  Informed Consent: I have reviewed the patients History and Physical, chart, labs and discussed the procedure including the risks, benefits and alternatives for the proposed anesthesia with the patient or authorized representative who has indicated his/her understanding and acceptance.     Dental Advisory Given  Plan Discussed with: Anesthesiologist, CRNA and Surgeon  Anesthesia Plan  Comments:         Anesthesia Quick Evaluation

## 2021-03-15 NOTE — Discharge Instructions (Addendum)
AMBULATORY SURGERY  DISCHARGE INSTRUCTIONS   1) The drugs that you were given will stay in your system until tomorrow so for the next 24 hours you should not:  A) Drive an automobile B) Make any legal decisions C) Drink any alcoholic beverage   2) You may resume regular meals tomorrow.  Today it is better to start with liquids and gradually work up to solid foods.  You may eat anything you prefer, but it is better to start with liquids, then soup and crackers, and gradually work up to solid foods.   3) Please notify your doctor immediately if you have any unusual bleeding, trouble breathing, redness and pain at the surgery site, drainage, fever, or pain not relieved by medication.    4) Additional Instructions:        Please contact your physician with any problems or Same Day Surgery at [phone], Monday through Friday 6 am to 4 pm, or [place] [facility] number at [phone].Keep arm elevated is much as possible. Work on finger movement is much as you can. Ice to the back of the wrist should help with pain and swelling today and tomorrow. Call office if you are having problems. Keep splint clean and dry. Pain medicine as previously directed with new prescription sent.

## 2021-03-15 NOTE — OR Nursing (Signed)
Patient arrived to operating room with gold band and gold diamond ring in place on left ring finger. Unable to remove in pre procedural area. Dr. Rosita Kea able to remove rings post anesthesia. 2 rings placed in bag with patient identification sticker and placed in patient's chart. Will send with patient to recovery area.

## 2021-03-16 ENCOUNTER — Encounter: Payer: Self-pay | Admitting: Orthopedic Surgery

## 2021-03-16 NOTE — Anesthesia Postprocedure Evaluation (Signed)
Anesthesia Post Note  Patient: Elizabeth Bean  Procedure(s) Performed: Left distal radius open reduction internal fixation (Left )  Patient location during evaluation: PACU Anesthesia Type: General Level of consciousness: awake and alert Pain management: pain level controlled Vital Signs Assessment: post-procedure vital signs reviewed and stable Respiratory status: spontaneous breathing, nonlabored ventilation, respiratory function stable and patient connected to nasal cannula oxygen Cardiovascular status: blood pressure returned to baseline and stable Postop Assessment: no apparent nausea or vomiting Anesthetic complications: no   No complications documented.   Last Vitals:  Vitals:   03/15/21 1915 03/15/21 1940  BP: 139/69 130/89  Pulse: 72 75  Resp: 17 16  Temp: (!) 36.2 C   SpO2: 96% 96%    Last Pain:  Vitals:   03/15/21 1940  TempSrc:   PainSc: 5                  Lenard Simmer

## 2021-09-16 ENCOUNTER — Other Ambulatory Visit: Payer: Self-pay | Admitting: Orthopedic Surgery

## 2021-09-23 ENCOUNTER — Other Ambulatory Visit: Payer: Self-pay

## 2021-09-23 ENCOUNTER — Other Ambulatory Visit
Admission: RE | Admit: 2021-09-23 | Discharge: 2021-09-23 | Disposition: A | Discharge status: Home / Self Care | Payer: Medicare HMO | Source: Ambulatory Visit | Attending: Orthopedic Surgery | Admitting: Orthopedic Surgery

## 2021-09-23 HISTORY — DX: Pneumonia, unspecified organism: J18.9

## 2021-09-23 NOTE — Patient Instructions (Addendum)
Your procedure is scheduled on: 09/29/21 - Thursday Report to the Registration Desk on the 1st floor of the [facility]. To find out your arrival time, please call [phone] between 1PM - 3PM on: 09/28/21  [facility] on 09/27/21 at 8:00 am for EKG.  REMEMBER: Instructions that are not followed completely may result in serious medical risk, up to and including death; or upon the discretion of your surgeon and anesthesiologist your surgery may need to be rescheduled.  Do not eat food after midnight the night before surgery.  No gum chewing, lozengers or hard candies.  You may however, drink CLEAR liquids up to 2 hours before you are scheduled to arrive for your surgery. Do not drink anything within 2 hours of your scheduled arrival time.  Clear liquids include: - water  - apple juice without pulp - gatorade (not RED, PURPLE, OR BLUE) - black coffee or tea (Do NOT add milk or creamers to the coffee or tea) Do NOT drink anything that is not on this list.  Type 1 and Type 2 diabetics should only drink water.  In addition, your doctor has ordered for you to drink the provided  Ensure Pre-Surgery Clear Carbohydrate Drink  Drinking this carbohydrate drink up to two hours before surgery helps to reduce insulin resistance and improve patient outcomes. Please complete drinking 2 hours prior to scheduled arrival time.  TAKE THESE MEDICATIONS THE MORNING OF SURGERY WITH A SIP OF WATER: - levothyroxine (SYNTHROID) 88 MCG tablet   Follow recommendations from Cardiologist, Pulmonologist or PCP regarding stopping Aspirin, Coumadin, Plavix, Eliquis, Pradaxa, or Pletal.  One week prior to surgery: Stop Anti-inflammatories (NSAIDS) such as Advil, Aleve, Ibuprofen, Motrin, Naproxen, Naprosyn and Aspirin based products such as Excedrin, Goodys Powder, BC Powder.  Stop ANY OVER THE COUNTER supplements until after surgery.  You may take Tylenol if needed for pain up until  the day of surgery.  No Alcohol for 24 hours before or after surgery.  No Smoking including e-cigarettes for 24 hours prior to surgery.  No chewable tobacco products for at least 6 hours prior to surgery.  No nicotine patches on the day of surgery.  Do not use any "recreational" drugs for at least a week prior to your surgery.  Please be advised that the combination of cocaine and anesthesia may have negative outcomes, up to and including death. If you test positive for cocaine, your surgery will be cancelled.  On the morning of surgery brush your teeth with toothpaste and water, you may rinse your mouth with mouthwash if you wish. Do not swallow any toothpaste or mouthwash.  Use CHG Soap or wipes as directed on instruction sheet.  Do not wear jewelry, make-up, hairpins, clips or nail polish.  Do not wear lotions, powders, or perfumes.   Do not shave body from the neck down 48 hours prior to surgery just in case you cut yourself which could leave a site for infection.  Also, freshly shaved skin may become irritated if using the CHG soap.  Contact lenses, hearing aids and dentures may not be worn into surgery.  Do not bring valuables to the hospital. [facility] is not responsible for any missing/lost belongings or valuables.   Notify your doctor if there is any change in your medical condition (cold, fever, infection).  Wear comfortable clothing (specific to your surgery type) to the hospital.  After surgery, you can help prevent lung complications by doing breathing exercises.  Take  deep breaths and cough every 1-2 hours. Your doctor may order a device called an Incentive Spirometer to help you take deep breaths. When coughing or sneezing, hold a pillow firmly against your incision with both hands. This is called "splinting." Doing this helps protect your incision. It also decreases belly discomfort.  If you are being admitted to the hospital overnight, leave your suitcase in the  car. After surgery it may be brought to your room.  If you are being discharged the day of surgery, you will not be allowed to drive home. You will need a responsible adult (18 years or older) to drive you home and stay with you that night.   If you are taking public transportation, you will need to have a responsible adult (18 years or older) with you. Please confirm with your physician that it is acceptable to use public transportation.   Please call the [facility] at [phone] if you have any questions about these instructions.  Surgery Visitation Policy:  Patients undergoing a surgery or procedure may have one family member or support person with them as long as that person is not COVID-19 positive or experiencing its symptoms.  That person may remain in the waiting area during the procedure and may rotate out with other people.  Inpatient Visitation:    Visiting hours are 7 a.m. to 8 p.m. Up to two visitors ages 16+ are allowed at one time in a patient room. The visitors may rotate out with other people during the day. Visitors must check out when they leave, or other visitors will not be allowed. One designated support person may remain overnight. The visitor must pass COVID-19 screenings, use hand sanitizer when entering and exiting the patient's room and wear a mask at all times, including in the patient's room. Patients must also wear a mask when staff or their visitor are in the room. Masking is required regardless of vaccination status.

## 2021-09-27 ENCOUNTER — Encounter: Payer: Self-pay | Admitting: Urgent Care

## 2021-09-27 ENCOUNTER — Other Ambulatory Visit: Payer: Self-pay

## 2021-09-27 ENCOUNTER — Other Ambulatory Visit
Admission: RE | Admit: 2021-09-27 | Discharge: 2021-09-27 | Disposition: A | Discharge status: Home / Self Care | Payer: Medicare HMO | Source: Ambulatory Visit | Attending: Orthopedic Surgery | Admitting: Orthopedic Surgery

## 2021-09-27 DIAGNOSIS — Z01810 Encounter for preprocedural cardiovascular examination: Secondary | ICD-10-CM | POA: Diagnosis present

## 2021-09-29 ENCOUNTER — Encounter
Admission: RE | Disposition: A | Discharge status: Home / Self Care | Payer: Self-pay | Source: Home / Self Care | Attending: Orthopedic Surgery

## 2021-09-29 ENCOUNTER — Encounter: Payer: Self-pay | Admitting: Orthopedic Surgery

## 2021-09-29 ENCOUNTER — Ambulatory Visit
Admission: RE | Admit: 2021-09-29 | Discharge: 2021-09-29 | Disposition: A | Discharge status: Home / Self Care | Payer: Medicare HMO | Attending: Orthopedic Surgery | Admitting: Orthopedic Surgery

## 2021-09-29 ENCOUNTER — Other Ambulatory Visit: Payer: Self-pay

## 2021-09-29 ENCOUNTER — Ambulatory Visit: Payer: Medicare HMO | Admitting: Urgent Care

## 2021-09-29 DIAGNOSIS — T8484XA Pain due to internal orthopedic prosthetic devices, implants and grafts, initial encounter: Secondary | ICD-10-CM | POA: Insufficient documentation

## 2021-09-29 DIAGNOSIS — Y831 Surgical operation with implant of artificial internal device as the cause of abnormal reaction of the patient, or of later complication, without mention of misadventure at the time of the procedure: Secondary | ICD-10-CM | POA: Diagnosis not present

## 2021-09-29 DIAGNOSIS — Z8616 Personal history of COVID-19: Secondary | ICD-10-CM | POA: Diagnosis not present

## 2021-09-29 DIAGNOSIS — Z885 Allergy status to narcotic agent status: Secondary | ICD-10-CM | POA: Diagnosis not present

## 2021-09-29 DIAGNOSIS — Z79890 Hormone replacement therapy (postmenopausal): Secondary | ICD-10-CM | POA: Insufficient documentation

## 2021-09-29 DIAGNOSIS — Z87891 Personal history of nicotine dependence: Secondary | ICD-10-CM | POA: Insufficient documentation

## 2021-09-29 DIAGNOSIS — Z79899 Other long term (current) drug therapy: Secondary | ICD-10-CM | POA: Insufficient documentation

## 2021-09-29 HISTORY — PX: HARDWARE REMOVAL: SHX979

## 2021-09-29 SURGERY — REMOVAL, HARDWARE
Anesthesia: General | Site: Wrist | Laterality: Left

## 2021-09-29 MED ORDER — CHLORHEXIDINE GLUCONATE 0.12 % MT SOLN: 15.0000 mL | Freq: Once | OROMUCOSAL | Status: AC

## 2021-09-29 MED ORDER — FENTANYL CITRATE (PF) 100 MCG/2ML IJ SOLN
INTRAMUSCULAR | Status: DC | PRN
  Administered 2021-09-29 (×2): 25 ug via INTRAVENOUS

## 2021-09-29 MED ORDER — LACTATED RINGERS IV SOLN: INTRAVENOUS | Status: DC

## 2021-09-29 MED ORDER — NEOMYCIN-POLYMYXIN B GU 40-200000 IR SOLN
Status: DC | PRN
  Administered 2021-09-29: 2 mL

## 2021-09-29 MED ORDER — HYDROCODONE-ACETAMINOPHEN 5-325 MG PO TABS: 1.0000 | ORAL_TABLET | Freq: Four times a day (QID) | ORAL | 0 refills | Status: AC | PRN

## 2021-09-29 MED ORDER — CEFAZOLIN SODIUM-DEXTROSE 2-4 GM/100ML-% IV SOLN
INTRAVENOUS | Status: AC
  Filled 2021-09-29: qty 100

## 2021-09-29 MED ORDER — FENTANYL CITRATE (PF) 100 MCG/2ML IJ SOLN
INTRAMUSCULAR | Status: AC
  Filled 2021-09-29: qty 2

## 2021-09-29 MED ORDER — DEXAMETHASONE SODIUM PHOSPHATE 10 MG/ML IJ SOLN
INTRAMUSCULAR | Status: DC | PRN
  Administered 2021-09-29: 4 mg via INTRAVENOUS

## 2021-09-29 MED ORDER — NEOMYCIN-POLYMYXIN B GU 40-200000 IR SOLN
Status: AC
  Filled 2021-09-29: qty 2

## 2021-09-29 MED ORDER — PROPOFOL 10 MG/ML IV BOLUS
INTRAVENOUS | Status: AC
  Filled 2021-09-29: qty 20

## 2021-09-29 MED ORDER — ONDANSETRON HCL 4 MG/2ML IJ SOLN: 4.0000 mg | Freq: Once | INTRAMUSCULAR | Status: DC | PRN

## 2021-09-29 MED ORDER — FAMOTIDINE 20 MG PO TABS
ORAL_TABLET | ORAL | Status: AC
  Administered 2021-09-29: 20 mg via ORAL
  Filled 2021-09-29: qty 1

## 2021-09-29 MED ORDER — CHLORHEXIDINE GLUCONATE 0.12 % MT SOLN
OROMUCOSAL | Status: AC
  Administered 2021-09-29: 15 mL via OROMUCOSAL
  Filled 2021-09-29: qty 15

## 2021-09-29 MED ORDER — FENTANYL CITRATE (PF) 100 MCG/2ML IJ SOLN: 25.0000 ug | INTRAMUSCULAR | Status: DC | PRN

## 2021-09-29 MED ORDER — PROPOFOL 10 MG/ML IV BOLUS
INTRAVENOUS | Status: DC | PRN
  Administered 2021-09-29: 140 mg via INTRAVENOUS
  Administered 2021-09-29: 60 mg via INTRAVENOUS

## 2021-09-29 MED ORDER — LIDOCAINE HCL (PF) 2 % IJ SOLN
INTRAMUSCULAR | Status: AC
  Filled 2021-09-29: qty 5

## 2021-09-29 MED ORDER — ORAL CARE MOUTH RINSE: 15.0000 mL | Freq: Once | OROMUCOSAL | Status: AC

## 2021-09-29 MED ORDER — FAMOTIDINE 20 MG PO TABS: 20.0000 mg | ORAL_TABLET | Freq: Once | ORAL | Status: AC

## 2021-09-29 MED ORDER — ONDANSETRON HCL 4 MG/2ML IJ SOLN
INTRAMUSCULAR | Status: DC | PRN
  Administered 2021-09-29: 4 mg via INTRAVENOUS

## 2021-09-29 MED ORDER — CEFAZOLIN SODIUM-DEXTROSE 2-4 GM/100ML-% IV SOLN
2.0000 g | INTRAVENOUS | Status: AC
  Administered 2021-09-29: 2 g via INTRAVENOUS

## 2021-09-29 MED ORDER — 0.9 % SODIUM CHLORIDE (POUR BTL) OPTIME
TOPICAL | Status: DC | PRN
  Administered 2021-09-29: 60 mL

## 2021-09-29 MED ORDER — LIDOCAINE HCL (CARDIAC) PF 100 MG/5ML IV SOSY
PREFILLED_SYRINGE | INTRAVENOUS | Status: DC | PRN
  Administered 2021-09-29: 80 mg via INTRAVENOUS

## 2021-09-29 SURGICAL SUPPLY — 35 items
APL PRP STRL LF DISP 70% ISPRP (MISCELLANEOUS) ×1
BNDG CMPR STD VLCR NS LF 5.8X4 (GAUZE/BANDAGES/DRESSINGS) ×1
BNDG ELASTIC 4X5.8 VLCR NS LF (GAUZE/BANDAGES/DRESSINGS) ×2 IMPLANT
BNDG ELASTIC 4X5.8 VLCR STR LF (GAUZE/BANDAGES/DRESSINGS) ×2 IMPLANT
CHLORAPREP W/TINT 26 (MISCELLANEOUS) ×2 IMPLANT
CUFF TOURN SGL QUICK 18X4 (TOURNIQUET CUFF) IMPLANT
DRAPE FLUOR MINI C-ARM 54X84 (DRAPES) ×2 IMPLANT
ELECT CAUTERY BLADE 6.4 (BLADE) ×2 IMPLANT
ELECT REM PT RETURN 9FT ADLT (ELECTROSURGICAL) ×2
ELECTRODE REM PT RTRN 9FT ADLT (ELECTROSURGICAL) ×1 IMPLANT
GAUZE 4X4 16PLY ~~LOC~~+RFID DBL (SPONGE) ×2 IMPLANT
GAUZE SPONGE 4X4 12PLY STRL (GAUZE/BANDAGES/DRESSINGS) ×2 IMPLANT
GAUZE XEROFORM 1X8 LF (GAUZE/BANDAGES/DRESSINGS) ×2 IMPLANT
GLOVE SURG SYN 9.0  PF PI (GLOVE) ×1
GLOVE SURG SYN 9.0 PF PI (GLOVE) ×1 IMPLANT
GOWN SRG 2XL LVL 4 RGLN SLV (GOWNS) ×1 IMPLANT
GOWN STRL NON-REIN 2XL LVL4 (GOWNS) ×2
GOWN STRL REUS W/ TWL LRG LVL3 (GOWN DISPOSABLE) ×1 IMPLANT
GOWN STRL REUS W/TWL LRG LVL3 (GOWN DISPOSABLE) ×2
KIT TURNOVER KIT A (KITS) ×2 IMPLANT
MANIFOLD NEPTUNE II (INSTRUMENTS) ×2 IMPLANT
NEEDLE FILTER BLUNT 18X 1/2SAF (NEEDLE) ×1
NEEDLE FILTER BLUNT 18X1 1/2 (NEEDLE) ×1 IMPLANT
NS IRRIG 500ML POUR BTL (IV SOLUTION) ×2 IMPLANT
PACK EXTREMITY ARMC (MISCELLANEOUS) ×2 IMPLANT
PAD CAST CTTN 4X4 STRL (SOFTGOODS) ×1 IMPLANT
PADDING CAST COTTON 4X4 STRL (SOFTGOODS) ×2
SCALPEL PROTECTED #15 DISP (BLADE) ×4 IMPLANT
SPLINT CAST 1 STEP 3X12 (MISCELLANEOUS) ×2 IMPLANT
SUT ETHILON 4-0 (SUTURE) ×2
SUT ETHILON 4-0 FS2 18XMFL BLK (SUTURE) ×1
SUT VICRYL 3-0 27IN (SUTURE) ×2 IMPLANT
SUTURE ETHLN 4-0 FS2 18XMF BLK (SUTURE) ×1 IMPLANT
SYR 3ML LL SCALE MARK (SYRINGE) ×2 IMPLANT
WATER STERILE IRR 500ML POUR (IV SOLUTION) ×2 IMPLANT

## 2021-09-29 NOTE — Discharge Instructions (Addendum)
Work on finger motion all you can Loosen Ace wrap if your fingers swell Keep dressing clean and dry Call office if having problems Pain medicine as directed  AMBULATORY SURGERY  DISCHARGE INSTRUCTIONS   The drugs that you were given will stay in your system until tomorrow so for the next 24 hours you should not:  Drive an automobile Make any legal decisions Drink any alcoholic beverage   You may resume regular meals tomorrow.  Today it is better to start with liquids and gradually work up to solid foods.  You may eat anything you prefer, but it is better to start with liquids, then soup and crackers, and gradually work up to solid foods.   Please notify your doctor immediately if you have any unusual bleeding, trouble breathing, redness and pain at the surgery site, drainage, fever, or pain not relieved by medication.    Additional Instructions:     Please contact your physician with any problems or Same Day Surgery at [phone], Monday through Friday 6 am to 4 pm, or [place] [facility] number at [phone].  

## 2021-09-29 NOTE — Anesthesia Preprocedure Evaluation (Signed)
Anesthesia Evaluation  Patient identified by MRN, date of birth, ID band Patient awake    Reviewed: Allergy & Precautions, NPO status , Patient's Chart, lab work & pertinent test results  History of Anesthesia Complications Negative for: history of anesthetic complications  Airway Mallampati: II  TM Distance: >3 FB Neck ROM: Full    Dental  (+) Poor Dentition   Pulmonary asthma , neg sleep apnea, former smoker,    breath sounds clear to auscultation- rhonchi (-) wheezing      Cardiovascular Exercise Tolerance: Good (-) hypertension(-) CAD, (-) Past MI, (-) Cardiac Stents and (-) CABG  Rhythm:Regular Rate:Normal - Systolic murmurs and - Diastolic murmurs    Neuro/Psych neg Seizures negative neurological ROS  negative psych ROS   GI/Hepatic negative GI ROS, Neg liver ROS,   Endo/Other  neg diabetesHypothyroidism   Renal/GU negative Renal ROS     Musculoskeletal  (+) Arthritis ,   Abdominal (+) + obese,   Peds  Hematology negative hematology ROS (+)   Anesthesia Other Findings Past Medical History: No date: Arthritis No date: Asthma     Comment:  mild per Dr Terence Lux note No date: COVID-19 No date: Hypercholesteremia No date: Hypothyroidism No date: Osteoporosis No date: Pneumonia 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     Comment:  saw Dr Karna Christmas in 2021  No date: Thyroid disease   Reproductive/Obstetrics                             Anesthesia Physical Anesthesia Plan  ASA: 2  Anesthesia Plan: General   Post-op Pain Management:    Induction: Intravenous  PONV Risk Score and Plan: 2 and Ondansetron and Dexamethasone  Airway Management Planned: LMA  Additional Equipment:   Intra-op Plan:   Post-operative Plan:   Informed Consent: I have reviewed the patients History and Physical, chart, labs and discussed the procedure including the risks, benefits and alternatives for  the proposed anesthesia with the patient or authorized representative who has indicated his/her understanding and acceptance.     Dental advisory given  Plan Discussed with: CRNA and Anesthesiologist  Anesthesia Plan Comments:         Anesthesia Quick Evaluation

## 2021-09-29 NOTE — Op Note (Signed)
09/29/2021  12:24 PM  PATIENT:  Elizabeth Bean  75 y.o. female  PRE-OPERATIVE DIAGNOSIS:  S/P ORIF fracture  Z98.890, Z87.81 Painful orthopedic hardware T84.84XA  POST-OPERATIVE DIAGNOSIS:  S/P ORIF fracture  Z98.890, Z87.81 Painful orthopedic hardware T84.84XA  PROCEDURE:  Procedure(s): Deep hardware removal, left wrist (Left)  SURGEON: Leitha Schuller, MD  ASSISTANTS: none  ANESTHESIA:   general  EBL:  Total I/O In: 450 [I.V.:350; IV Piggyback:100] Out: 2 [Blood:2]  BLOOD ADMINISTERED:none  DRAINS: none   LOCAL MEDICATIONS USED:  NONE  SPECIMEN:  No Specimen  DISPOSITION OF SPECIMEN:  N/A  COUNTS:  YES  TOURNIQUET:   Total Tourniquet Time Documented: Upper Arm (Left) - 16 minutes Total: Upper Arm (Left) - 16 minutes   IMPLANTS: none  DICTATION: .Dragon Dictation  patient was brought to the operating room and after adequate general anesthesia was obtained the left arm was prepped and draped in the usual sterile fashion.  After patient identification and timeout procedures were completed tourniquet was raised.  The prior volar approach was utilized going through the prior skin incision with the incision centered over the FCR tendon.  The tendon sheath was incised and the tendon retracted radially protect the radial artery and associated veins.  The scar tissue was incised so that the plate could be exposed proximally and distally with the 3 cortical screws removed and scar tissue removed around the distal end of the plate.  All distal screws were removed.  The plate was was removed without difficulty fracture was well-healed.  The wound was irrigated and then closed with 3-0 Vicryl subcutaneously and 4-0 nylon in a simple interrupted fashion for the skin.  Sterile dressing of Xeroform 4 x 4 web roll and Ace wrap applied with a tourniquet being let down prior to wound closure.  PLAN OF CARE: Discharge to home after PACU  PATIENT DISPOSITION:  PACU - hemodynamically  stable.

## 2021-09-29 NOTE — Transfer of Care (Signed)
Immediate Anesthesia Transfer of Care Note  Patient: Elizabeth Bean  Procedure(s) Performed: Deep hardware removal, left wrist (Left: Wrist)  Patient Location: PACU  Anesthesia Type:General  Level of Consciousness: awake, alert  and oriented  Airway & Oxygen Therapy: Patient Spontanous Breathing and Patient connected to face mask oxygen  Post-op Assessment: Report given to RN and Post -op Vital signs reviewed and stable  Post vital signs: Reviewed and stable  Last Vitals:  Vitals Value Taken Time  BP 146/82 09/29/21 1230  Temp    Pulse 71 09/29/21 1231  Resp 18 09/29/21 1231  SpO2 100 % 09/29/21 1231  Vitals shown include unvalidated device data.  Last Pain:  Vitals:   09/29/21 1020  TempSrc: Tympanic  PainSc: 0-No pain      Patients Stated Pain Goal: 0 (09/29/21 1020)  Complications: No notable events documented.

## 2021-09-29 NOTE — Anesthesia Procedure Notes (Signed)
Procedure Name: LMA Insertion Date/Time: 09/29/2021 11:43 AM Performed by: Iran Planas, CRNA Pre-anesthesia Checklist: Patient identified, Patient being monitored, Timeout performed, Emergency Drugs available and Suction available Patient Re-evaluated:Patient Re-evaluated prior to induction Oxygen Delivery Method: Circle system utilized Preoxygenation: Pre-oxygenation with 100% oxygen Induction Type: IV induction Ventilation: Mask ventilation without difficulty LMA: LMA inserted LMA Size: 3.5 Tube type: Oral Number of attempts: 1 Placement Confirmation: positive ETCO2 and breath sounds checked- equal and bilateral Tube secured with: Tape Dental Injury: Teeth and Oropharynx as per pre-operative assessment

## 2021-09-29 NOTE — H&P (Signed)
Chief Complaint  Patient presents with   Wrist Pain  RECHECK LEFT WRIST    History of the Present Illness: Elizabeth Bean is a 75 y.o. female here today.   The patient presents for evaluation of left arm pain. The patient has had a prior right wrist ORIF in 03/2021. She is also having left arm pain. 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  The patient states her left wrist is not bothering her, but she has tightness in her fingers. She states she can not lift anything due to pain. She is accompanied by an adult female today.  The patient is not diabetic.  I have reviewed past medical, surgical, social and family history, and allergies as documented in the EMR.  Past Medical History: Past Medical History:  Diagnosis Date   Chicken pox   Fractures, stress  foot   Osteopenia   Shingles   Thyroid disease   Past Surgical History: Past Surgical History:  Procedure Laterality Date   APPENDECTOMY   EGD 08/04/2014  Intestinal Metaplasia & Esophagitis - repeat 1 year per Dr. Shelle Iron   TONSILLECTOMY   Past Family History: Family History  Problem Relation Age of Onset   Lung cancer Father   Throat cancer Other   Alzheimer's disease Mother   Throat cancer Brother   Colon cancer Neg Hx   Colon polyps Neg Hx   Inflammatory bowel disease Neg Hx   Medications: Current Outpatient Medications Ordered in Epic  Medication Sig Dispense Refill   cholecalciferol (VITAMIN D3) 2,000 unit capsule Take 2,000 Units by mouth once daily.   fluticasone propionate (FLONASE) 50 mcg/actuation nasal spray Place 2 sprays into both nostrils once daily   levothyroxine (SYNTHROID) 88 MCG tablet TAKE 1 TABLET BY MOUTH ONCE DAILY ON AN EMPTY STOMACH WITH A FULL GLASS OF WATER AT LEAST 30 TO 60 MINUTES BEFORE BREAKFAST 90 tablet 0   multivitamin tablet Take 1 tablet by mouth once daily   pravastatin  (PRAVACHOL) 40 MG tablet Take 1 tablet by mouth nightly 90 tablet 1   fluticasone propion-salmeteroL (ADVAIR DISKUS) 250-50 mcg/dose diskus inhaler Inhale 1 inhalation into the lungs every 12 (twelve) hours for 30 days 1 each 2   No current Epic-ordered facility-administered medications on file.   Allergies: Allergies  Allergen Reactions   Tramadol Itching    Body mass index is 33.41 kg/m.  Review of Systems: A comprehensive 14 point ROS was performed, reviewed, and the pertinent orthopaedic findings are documented in the HPI.  Vitals:  09/14/21 1402  BP: (!) 140/80    General Physical Examination:   General/Constitutional: No apparent distress: well-nourished and well developed. Eyes: Pupils equal, round with synchronous movement. Lungs: Clear to auscultation HEENT: Normal Vascular: No edema, swelling or tenderness, except as noted in detailed exam. Cardiac: Heart rate and rhythm is regular. Integumentary: No impressive skin lesions present, except as noted in detailed exam. Neuro/Psych: Normal mood and affect, oriented to person, place and time.  On exam, left wrist has 30 degrees extension and 30 degrees flexion. Left shoulder weakness. Forward flexion is 140 degrees, abduction 120 degrees.  Radiographs:  AP, lateral, and oblique x-rays of the left wrist were ordered and personally reviewed today. These show healed fracture. She has smooth pegs and screws. This may be partially into the joint and I previously talked about a hardware removal and recommend that bone is entirely healed  with mild posttraumatic arthritis.  AP and lateral x-rays of the left humerus were ordered and personally reviewed today. These show intact humerus. No bony abnormality.  X-ray Impression Normal humerus.  AP, scapular Y, and axillary x-rays of the left shoulder were ordered and personally reviewed today. These show decreased subacromial space, some sclerosis at the tuberosity. Mild  glenohumeral degenerative change. Intact subacromial space. Mild AC arthritis.  X-ray Impression Mild arthritis and mild evidence of impingement, left shoulder.  Assessment: ICD-10-CM  1. S/P ORIF (open reduction internal fixation) fracture Z98.890  Z87.81  2. Acute pain of left shoulder M25.512  3. Rotator cuff tendinitis, left M75.82  4. Painful orthopaedic hardware (CMS-HCC) [phone]   Plan:  The patient has clinical findings of left shoulder pain.  We discussed the patient's x-ray findings. I explained her left wrist fracture has healed. I recommend a left shoulder subacromial injection today. We discussed hardware removal in detail.  The patient will follow up as needed.  Left Shoulder Subacromial Injection: The skin was first prepped with chlorhexidine inferolaterally. Ethyl Chloride spray was then applied. A 25 gauge needle was inserted and 1 cc Betamethasone and 2 cc Marcaine was injected. The patient tolerated this well. A Band-Aid was applied.  I, Amil Amen, Quality Documentation Specialist, completed documentation using DAX technology.   Electronically signed by Marlena Clipper, MD at 09/14/2021 8:13 PM EDT  Reviewed  H+P. No changes noted.

## 2021-09-29 NOTE — Anesthesia Postprocedure Evaluation (Signed)
Anesthesia Post Note  Patient: KAILLY RICHOUX  Procedure(s) Performed: Deep hardware removal, left wrist (Left: Wrist)  Patient location during evaluation: PACU Anesthesia Type: General Level of consciousness: awake and alert and oriented Pain management: pain level controlled Vital Signs Assessment: post-procedure vital signs reviewed and stable Respiratory status: spontaneous breathing, nonlabored ventilation and respiratory function stable Cardiovascular status: blood pressure returned to baseline and stable Postop Assessment: no signs of nausea or vomiting Anesthetic complications: no   No notable events documented.   Last Vitals:  Vitals:   09/29/21 1312 09/29/21 1326  BP: (!) 147/65 (!) 148/79  Pulse: 67 73  Resp: 15 16  Temp: (!) 36.1 C (!) 36.1 C  SpO2: 95% 95%    Last Pain:  Vitals:   09/29/21 1326  TempSrc: Temporal  PainSc:                  Shonique Pelphrey

## 2021-09-29 NOTE — Progress Notes (Signed)
  Perioperative Services Pre-Admission/Anesthesia Testing     Date: 09/29/21  Name: Elizabeth Bean MRN:   [id number]  Re: Surgical clearance  Surgical clearance received from Dr. Durene Fruits office (pulmonary medicine). Patient has been cleared for the planned elective orthopedic procedure scheduled for 09/29/2021 scheduled with Dr. Kennedy Bucker, MD.  Pulmonologist notes that patient is optimized for surgery and may proceed with an overall LOW risk stratification. Copy of signed clearance form placed on patient's OR chart for review by the surgical/anesthetic team on the day of his procedure.   Quentin Mulling, MSN, APRN, FNP-C, [facility]  Peri-operative Services Nurse Practitioner Phone: [phone] 09/29/21 8:43 AM

## 2021-09-30 ENCOUNTER — Encounter: Payer: Self-pay | Admitting: Orthopedic Surgery

## 2021-10-07 DIAGNOSIS — Z23 Encounter for immunization: Secondary | ICD-10-CM | POA: Diagnosis not present

## 2022-11-09 ENCOUNTER — Ambulatory Visit (LOCAL_COMMUNITY_HEALTH_CENTER): Payer: Medicare HMO

## 2022-11-09 DIAGNOSIS — Z23 Encounter for immunization: Secondary | ICD-10-CM

## 2022-11-09 DIAGNOSIS — Z7185 Encounter for immunization safety counseling: Secondary | ICD-10-CM

## 2022-11-09 NOTE — Progress Notes (Signed)
  Are you feeling sick today? No   Have you ever received a dose of COVID-19 Vaccine? [employer], [place], [place], [place], Other) yes  If yes, which vaccine and how many doses? Pfizer and 5 doses     Did you bring the vaccination record card or other documentation?  Yes   Do you have a health condition or are undergoing treatment that makes you moderately or severely immunocompromised? This would include, but not be limited to: cancer, HIV, organ transplant, immunosuppressive therapy/high-dose corticosteroids, or moderate/severe primary immunodeficiency.  No  Have you received COVID-19 vaccine before or during hematopoietic cell transplant (HCT) or CAR-T-cell therapies? No  Have you ever had an allergic reaction to: (This would include a severe allergic reaction or a reaction that caused hives, swelling, or respiratory distress, including wheezing.) A component of a COVID-19 vaccine or a previous dose of COVID-19 vaccine? No   Have you ever had an allergic reaction to another vaccine (other thanCOVID-19 vaccine) or an injectable medication? (This would include a severe allergic reaction or a reaction that caused hives, swelling, or respiratory distress, including wheezing.)   No    Do you have a history of any of the following:  Myocarditis or Pericarditis No  Dermal fillers:  No  Multisystem Inflammatory Syndrome (MIS-C or MIS-A)? No  COVID-19 disease within the past 3 months? No  Vaccinated with monkeypox vaccine in the last 4 weeks? No  Tolerated Pfizer Comirnaty 2023-24 and High dose flu vaccine well today. Updated [facility] copy and covid card given. Stayed for 15 min observation without problem. Jerel Shepherd, RN

## 2023-01-19 IMAGING — CT CT CERVICAL SPINE W/O CM
3 of 4 series · 11 of 33 positions shown, 13 images · non-contrast
Comparison: CT chest 10/18/2010

CLINICAL DATA: Head trauma. Status post fall hitting back of head
on dresser.

EXAM:
CT HEAD WITHOUT CONTRAST
CT CERVICAL SPINE WITHOUT CONTRAST
TECHNIQUE: Multidetector CT imaging of the head and cervical spine was
performed following the standard protocol without intravenous
contrast. Multiplanar CT image reconstructions of the cervical spine
were also generated.

[Series 6: orthogonal bone · axial · 0.27mm/px · z∈[+423,+526]mm · 3 of 109 slices shown, 4 images]
[im 31/109  soft-tissue]
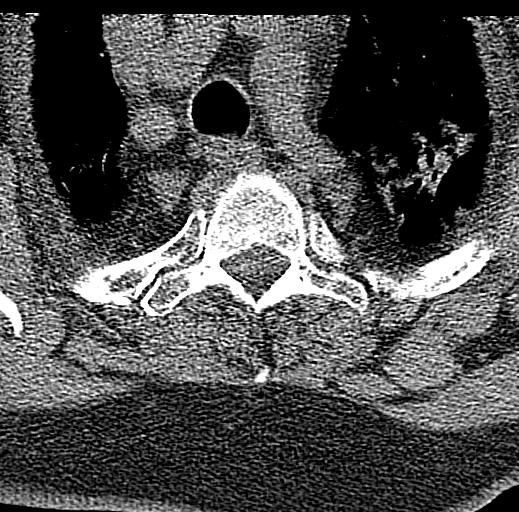
[im 31/109  bone]
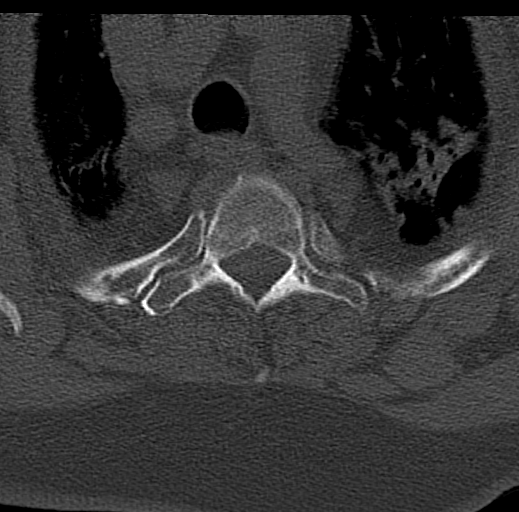
[im 62/109  bone]
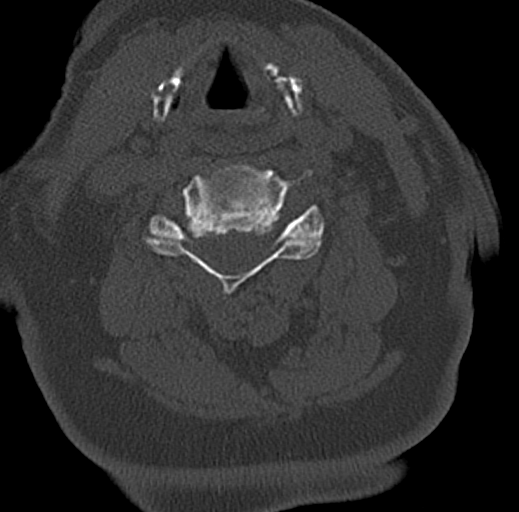
[im 93/109  bone]
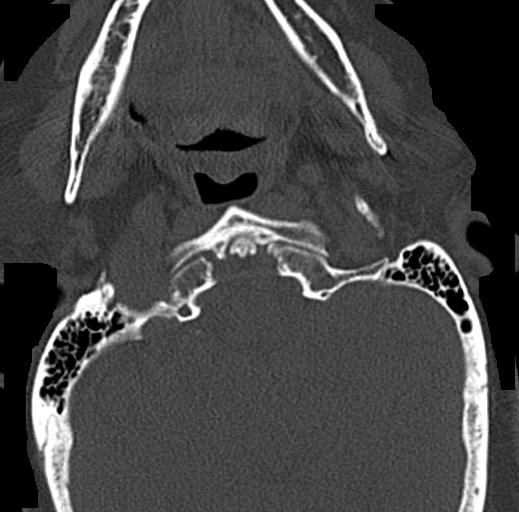

[Series 7: sagittal bone · sagittal · 0.29mm/px · 5 of 71 slices shown, 6 images]
[im 24/71  bone]
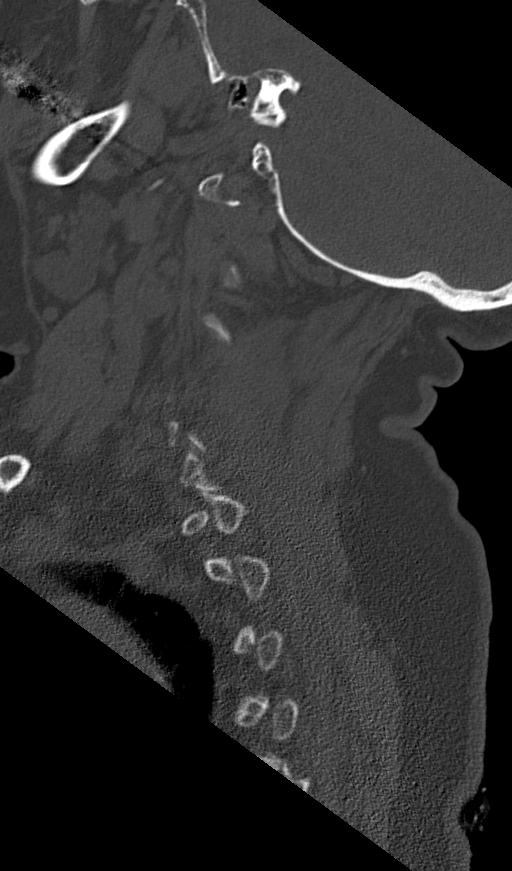
[im 30/71  bone]
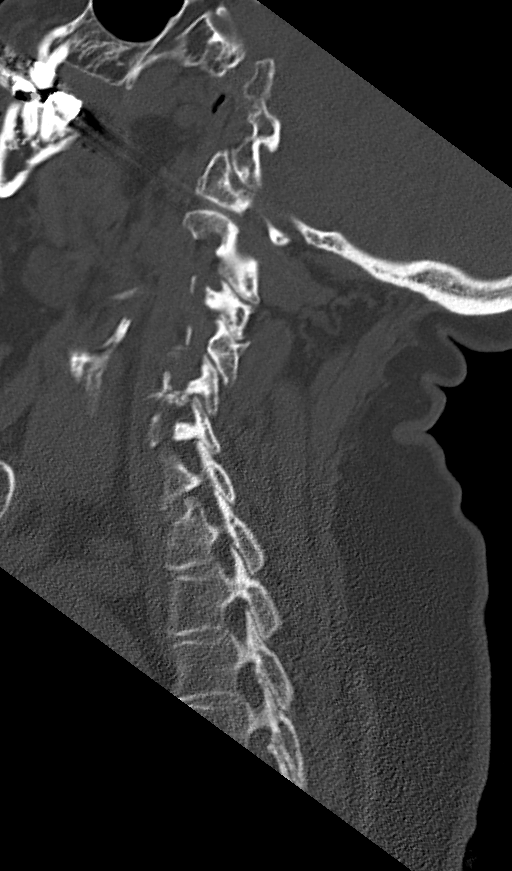
[im 36/71  soft-tissue]
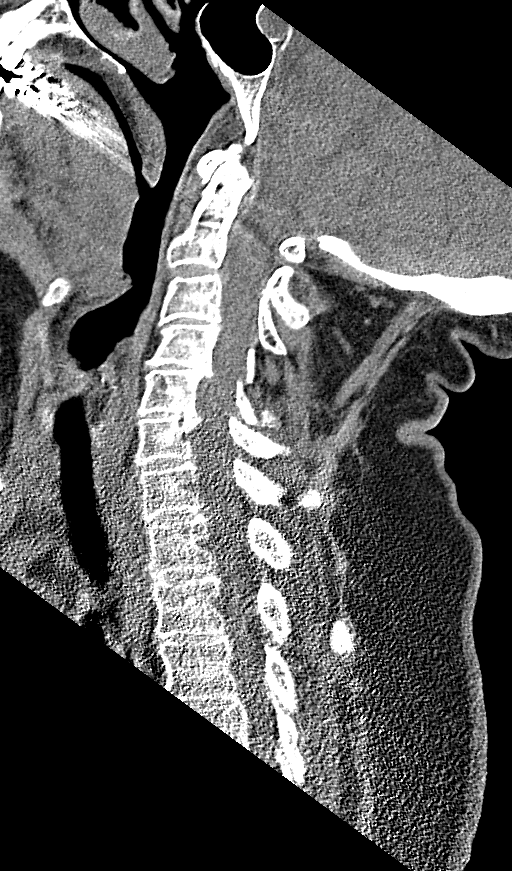
[im 36/71  bone]
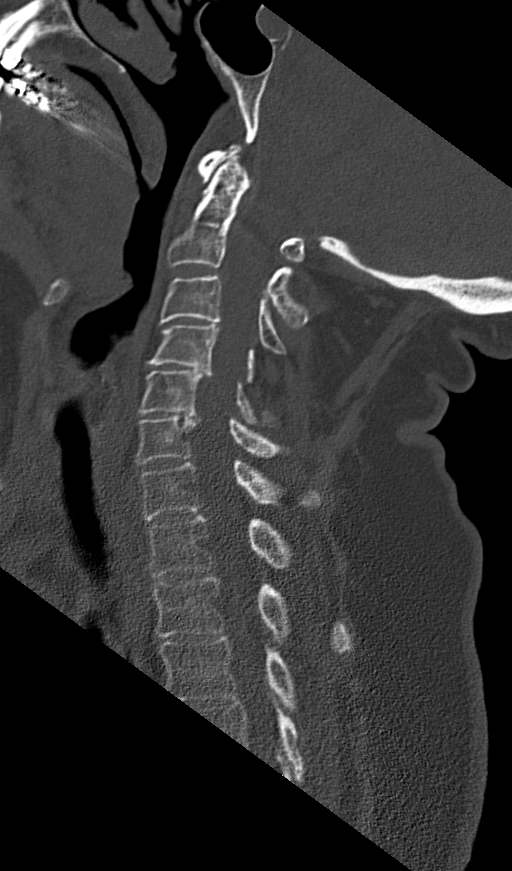
[im 41/71  bone]
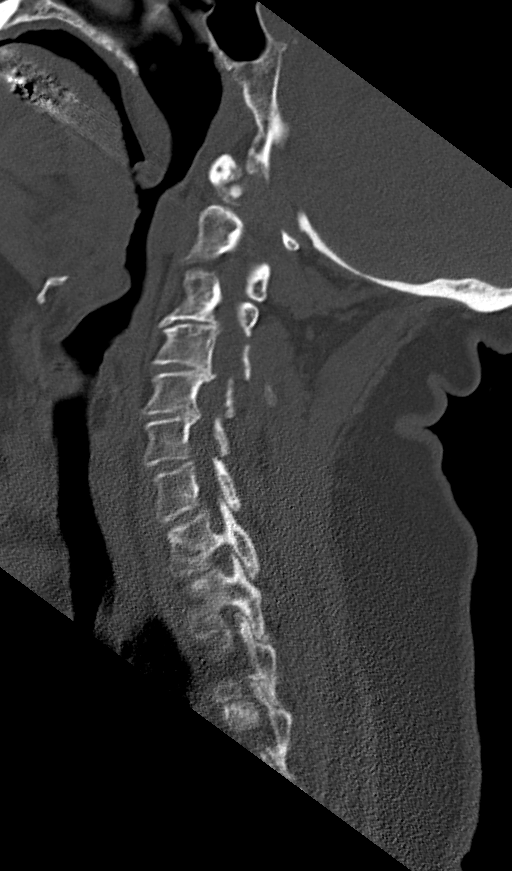
[im 47/71  bone]
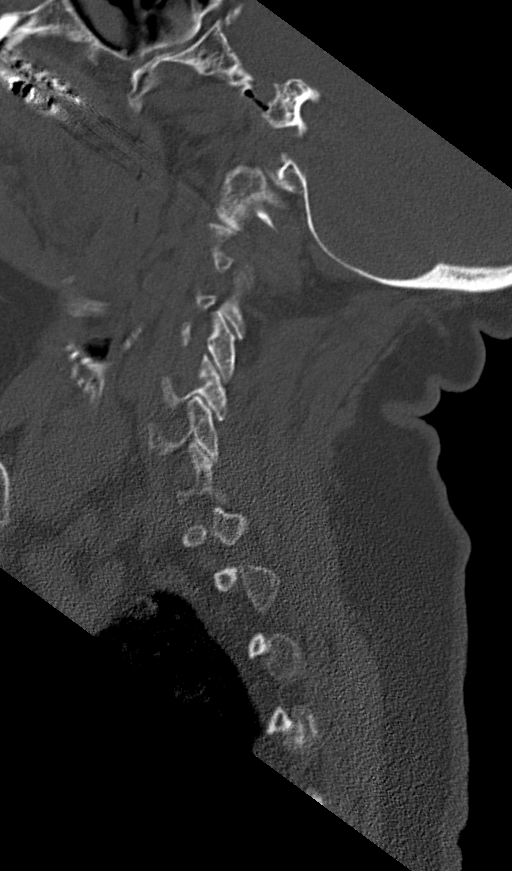

[Series 8: coronal bone · coronal · 0.26mm/px · 3 of 68 slices shown]
[im 21/68  bone]
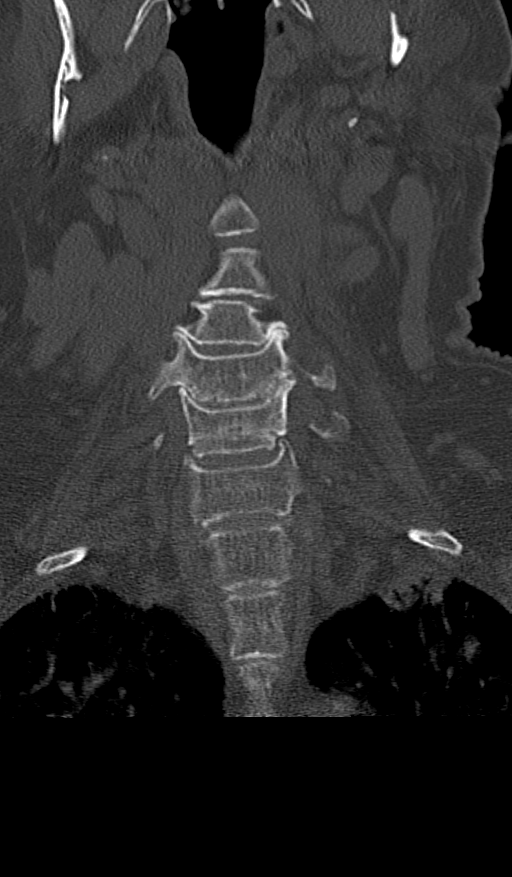
[im 30/68  bone]
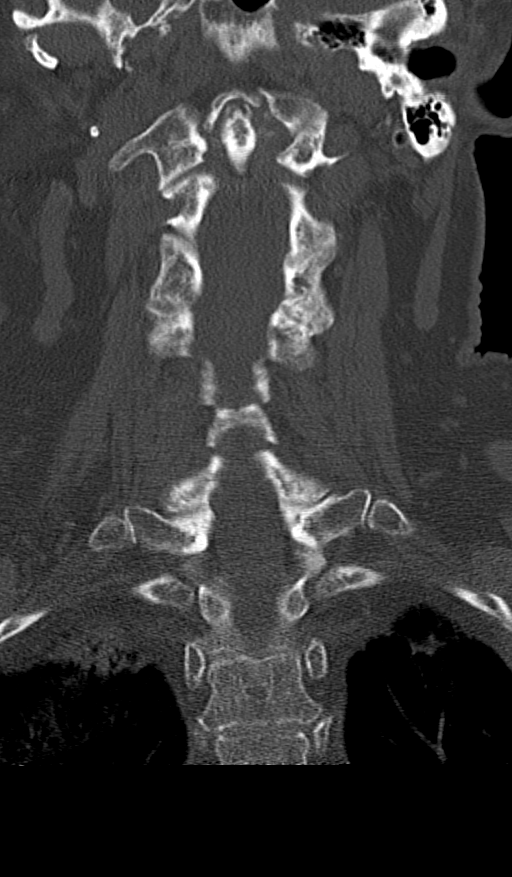
[im 38/68  bone]
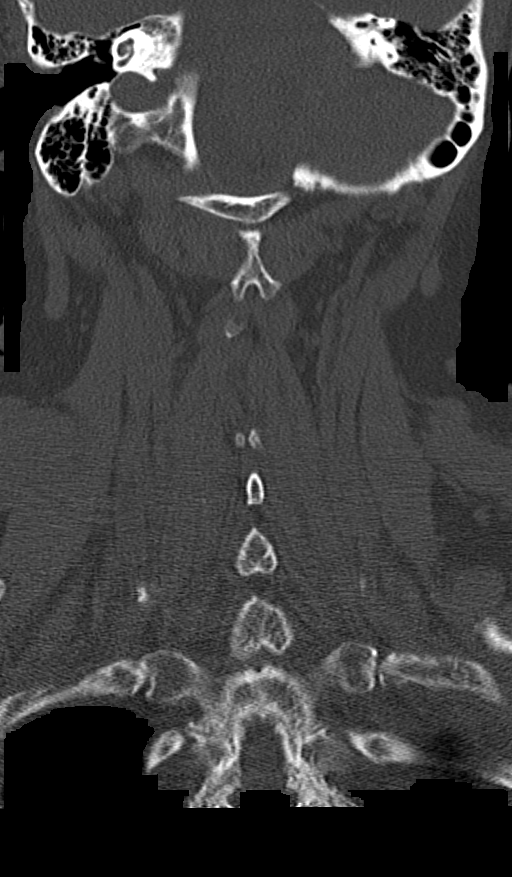

[11 of 33 positions shown; findings below may reference images not displayed]

FINDINGS: CT HEAD FINDINGS

Brain:

Cerebral ventricle sizes are concordant with the degree of cerebral
volume loss. Patchy and confluent areas of decreased attenuation are
noted throughout the deep and periventricular white matter of the
cerebral hemispheres bilaterally, compatible with chronic
microvascular ischemic disease.

No evidence of large-territorial acute infarction. No parenchymal
hemorrhage. No mass lesion. No extra-axial collection.

No mass effect or midline shift. No hydrocephalus. Basilar cisterns
are patent.

Vascular: No hyperdense vessel.

Skull: No acute fracture or focal lesion.

Sinuses/Orbits: Paranasal sinuses and mastoid air cells are clear.
Bilateral lens replacement. Otherwise the orbits are unremarkable.

Other: Mild posterior scalp subcutaneus soft tissue edema.

CT CERVICAL SPINE FINDINGS

Alignment: Normal.

Skull base and vertebrae: Multilevel degenerative changes of the
spine. No acute fracture. No aggressive appearing focal osseous
lesion or focal pathologic process.

Soft tissues and spinal canal: No prevertebral fluid or swelling. No
visible canal hematoma.

Upper chest: Redemonstration of marked nodular-like biapical
pleural/pulmonary scarring.

Other: None.
IMPRESSION: 1. No acute intracranial abnormality.
2. No acute displaced fracture or traumatic listhesis of the
cervical spine.

## 2024-06-12 ENCOUNTER — Ambulatory Visit
Admission: RE | Admit: 2024-06-12 | Discharge: 2024-06-12 | Disposition: A | Discharge status: Home / Self Care | Source: Ambulatory Visit | Attending: Orthopedic Surgery | Admitting: Orthopedic Surgery

## 2024-06-12 ENCOUNTER — Other Ambulatory Visit: Payer: Self-pay | Admitting: Orthopedic Surgery

## 2024-06-12 DIAGNOSIS — S42222A 2-part displaced fracture of surgical neck of left humerus, initial encounter for closed fracture: Secondary | ICD-10-CM

## 2024-06-12 NOTE — Progress Notes (Signed)
 Chief Complaint Chief Complaint  Patient presents with  . Left Shoulder - Pain    Reason for Visit The patient is a 78 year old female who presented for complaints of her left shoulder.  The patient sustained a left shoulder fracture around June 02, 2024 seen by Dr. Nonnie at the walk-in clinic.  The patient is here for evaluation of her left shoulder.  The patient was placed into a shoulder sling for protection which she has been very uncomfortable with.  She has been using occasional hydrocodone  at home.  She has not been trying to move her arm because of the discomfort.  She has trouble sleeping.  She cannot use her arm for getting dressed.  She is here with her today.  The patient is not a diabetic.   Medications Current Outpatient Medications  Medication Sig Dispense Refill  . acetaminophen  (TYLENOL ) 500 MG tablet Take by mouth    . aspirin 81 MG EC tablet Take by mouth once daily    . beta-carotene,A,-vits C,E/mins (VISION FORMULA ORAL) Take by mouth One daily    . cholecalciferol (VITAMIN D3) 2,000 unit capsule Take 2,000 Units by mouth once daily.    . fluticasone propion-salmeteroL (ADVAIR DISKUS) 250-50 mcg/dose diskus inhaler Inhale 1 Puff into the lungs every 12 (twelve) hours 60 each 3  . fluticasone propionate (FLONASE) 50 mcg/actuation nasal spray Place 1 spray into both nostrils 2 (two) times daily as needed 16 g 3  . levothyroxine (SYNTHROID) 100 MCG tablet TAKE 1 TABLET BY MOUTH ONCE DAILY ON AN EMPTY STOMACH WITH A GLASS OF WATER AT LEAST 30-60 MINUTES BEFORE BREAKFAST 90 tablet 0  . loratadine (CLARITIN) 10 mg tablet Take 1 tablet (10 mg total) by mouth once daily as needed 60 tablet 1  . multivitamin tablet Take 1 tablet by mouth once daily    . nystatin (MYCOSTATIN) 100,000 unit/gram powder Apply topically 2 (two) times daily 60 g 1  . pravastatin (PRAVACHOL) 40 MG tablet TAKE 1 TABLET BY MOUTH AT BEDTIME 90 tablet 0  . HYDROcodone -acetaminophen  (NORCO) 5-325 mg tablet  Take 1 tablet by mouth every 6 (six) hours as needed for Pain for up to 30 doses 30 tablet 0   No current facility-administered medications for this visit.    Allergies Tramadol, Hycodan [hydrocodone -homatropine], and Hydrocodone   Histories Past Medical History: Past Medical History:  Diagnosis Date  . Chicken pox   . Fractures, stress    foot  . Osteopenia   . Shingles   . Thyroid  disease     Past Surgical History: Past Surgical History:  Procedure Laterality Date  . EGD  08/04/2014   Intestinal Metaplasia & Esophagitis - repeat 1 year per Dr. Jeri  . Deep hardware removal left wrist Left 09/29/2021   by Dr. Kathlynn  . APPENDECTOMY    . TONSILLECTOMY      Social History: Social History   Socioeconomic History  . Marital status: Married    Spouse name: Dorinda Stehr  Occupational History  . Occupation: Retired  Tobacco Use  . Smoking status: Former    Current packs/day: 0.00    Average packs/day: 0.8 packs/day for 20.0 years (15.0 ttl pk-yrs)    Types: Cigarettes    Start date: 31    Quit date: 2012    Years since quitting: 13.5    Passive exposure: Past  . Smokeless tobacco: Never  Vaping Use  . Vaping status: Never Used  Substance and Sexual Activity  . Alcohol use: No  Alcohol/week: 0.0 standard drinks of alcohol  . Drug use: No  . Sexual activity: Defer    Partners: Male   Social Drivers of Health   Financial Resource Strain: Low Risk  (05/14/2024)   Overall Financial Resource Strain (CARDIA)   . Difficulty of Paying Living Expenses: Not hard at all  Food Insecurity: No Food Insecurity (05/14/2024)   Hunger Vital Sign   . Worried About Programme researcher, broadcasting/film/video in the Last Year: Never true   . Ran Out of Food in the Last Year: Never true  Transportation Needs: No Transportation Needs (05/14/2024)   PRAPARE - Transportation   . Lack of Transportation (Medical): No   . Lack of Transportation (Non-Medical): No  Housing Stability: Low Risk  (05/14/2024)    Housing Stability Vital Sign   . Unable to Pay for Housing in the Last Year: No   . Number of Times Moved in the Last Year: 0   . Homeless in the Last Year: No    Family History: Family History  Problem Relation Name Age of Onset  . Lung cancer Father    . Throat cancer Other sibling   . Alzheimer's disease Mother    . Throat cancer Brother    . Colon cancer Neg Hx    . Colon polyps Neg Hx    . Inflammatory bowel disease Neg Hx      Review of Systems A comprehensive 14 point ROS was performed, reviewed, and the pertinent orthopaedic findings are documented in the HPI.  Exam BP 136/86   Ht 144.8 cm (4' 9)   Wt 70.8 kg (156 lb)   LMP  (LMP Unknown)   BMI 33.76 kg/m    General/Constitutional: NAD, conversant Eyes: Pupils equal and round, extraocular movements intact ENT: atraumatic external nose and ears, moist mucous membranes Respiratory: non-labored breathing, symmetric chest rise, chest sounds clear. Cardiovascular: no visible lower extremity edema, peripheral pulses present, regular rate and rhythm  Skin: normal skin turgor, warm and dry Neurological: cranial nerves grossly intact, sensation grossly intact Psychological:  Appropriate mood and affect; appropriate judgment Musculoskeletal: as detailed below:  General: Well developed, well nourished 78 y.o. female in no apparent distress.  Normal affect.  Normal communication.  Patient answers questions appropriately.  The patient is in a wheelchair with no gait noted.    Left upper extremity: Examination left shoulder and arm reveals the shoulder sling in good position.  There is positive ecchymosis and edema throughout the proximal humerus and elbow region.  She is quite tender at the proximal humerus to any palpation.  No range of motion was attempted with rotation or flexion.  She has very diminished grip strength on the left side as compared to the right.  Neurovascular is intact.  Radiology: Xrays of the left  shoulder were ordered and interpreted 06/12/2024, with 3 views using Y view, Grashey view, Zanka views.  Xrays revealed the left shoulder with a proximal humerus 2 part displaced fracture involving the greater tuberosity and lesser tuberosity with shortening to the humeral shaft.  There is dislocation of the humeral head from the glenoid.  There is mild dislocation of the greater tuberosity.  The [facility] joint is intact.  There is no callus formation or fracture healing noted on x-ray.     Impression Encounter Diagnoses  Name Primary?  . Closed 2-part displaced fracture of surgical neck of left humerus, initial encounter Yes  . Acute pain of left shoulder   . Adult BMI 33.0-33.9  kg/sq m     Plan   1.  I discussed the physical exam finding as well as the x-ray results and previous treatment.  Dr. Tobie evaluated the x-rays.  We reviewed her previous labs and medical history. 2.  After discussing the left shoulder injury.  We reviewed the risk, benefits and alternatives to doing a left proximal humerus reverse total shoulder replacement based on her 2 part proximal humerus fracture.  The patient seemed understand and agree to setting up for doing this on June 17, 2024.  The patient will most likely be discharged home the day of surgery versus possible observation and discharge on the following day.  She will be in a shoulder immobilizer for protection. 3.  The patient will see Dr. Tobie for further discussion about doing the left reverse shoulder replacement for the 2 part displaced proximal humerus fracture on Monday. 4.  The patient will use hydrocodone  for breakthrough pain. 5.  The patient will use ice. 6.  She will stay in her shoulder sling for protection. 7.  The patient be set up for a stat CT of the left shoulder to evaluate the proximal humerus 2 part fracture for further displacement and back injury for surgical evaluation for the left reverse shoulder replacement next week. 8.  We will see her  on Monday for further discussion about the left numerous fracture.  This note was generated in part with voice recognition software and I apologize for any typographical errors that were not detected and corrected.   Dorn Krystal Doyne PA-C, [email or web address]

## 2024-06-16 ENCOUNTER — Other Ambulatory Visit: Payer: Self-pay | Admitting: Orthopedic Surgery

## 2024-06-16 ENCOUNTER — Encounter
Admission: RE | Admit: 2024-06-16 | Discharge: 2024-06-16 | Disposition: A | Discharge status: Home / Self Care | Source: Ambulatory Visit | Attending: Orthopedic Surgery | Admitting: Orthopedic Surgery

## 2024-06-16 ENCOUNTER — Other Ambulatory Visit: Payer: Self-pay

## 2024-06-16 VITALS — Ht <= 58 in | Wt 156.0 lb

## 2024-06-16 DIAGNOSIS — Z01810 Encounter for preprocedural cardiovascular examination: Secondary | ICD-10-CM

## 2024-06-16 DIAGNOSIS — E7800 Pure hypercholesterolemia, unspecified: Secondary | ICD-10-CM

## 2024-06-16 DIAGNOSIS — Z01818 Encounter for other preprocedural examination: Secondary | ICD-10-CM

## 2024-06-16 HISTORY — DX: Unspecified fracture of shaft of humerus, unspecified arm, initial encounter for closed fracture: S42.309A

## 2024-06-16 HISTORY — DX: Personal history of nicotine dependence: Z87.891

## 2024-06-16 HISTORY — DX: Chronic cough: R05.3

## 2024-06-16 HISTORY — DX: Chronic obstructive pulmonary disease, unspecified: J44.9

## 2024-06-16 NOTE — Progress Notes (Signed)
 ORTHOPEDIC SURGERY - SHOULDER EVALUATION  Chief Complaint: No chief complaint on file.   History of Present Illness: 06/16/24: Elizabeth Bean is a 78 y.o. female  referred by [facility] for L shoulder evaluation and management.  Prior medical records were reviewed.  She was eval noted in the urgent care on 06/02/2024 after she is fell into a hole.  Radiographs at that time showed a proximal humerus fracture.  She was then evaluated by Krystal Doyne, PA on 06/12/2024 where radiographs showed complete displacement of the three-part proximal humerus fracture.  CT scan was obtained.  Patient is here to review the results and discuss further treatment options.  She currently rates pain severity as a 10/10. Her symptoms began as described above while she was trying to move her neighbors trash can bin.  She has had severe pain since that time.  She is taking hydrocodone  approximately 2/day.  She has had significant swelling and bruising about the entire extremity.  She is right-handed.  She is retired.  She has never smoked.  She has had prior left wrist surgery by Dr. Kathlynn approximately 2 years ago.  PMHx, PSurgHx, Fam Hx, Soc Hx, Meds, Allergies: Past Medical History:  Diagnosis Date  . Chicken pox   . Fractures, stress    foot  . Osteopenia   . Shingles   . Thyroid  disease     Past Surgical History:  Procedure Laterality Date  . EGD  08/04/2014   Intestinal Metaplasia & Esophagitis - repeat 1 year per Dr. Jeri  . Deep hardware removal left wrist Left 09/29/2021   by Dr. Kathlynn  . APPENDECTOMY    . TONSILLECTOMY      Family History  Problem Relation Age of Onset  . Lung cancer Father   . Throat cancer Other   . Alzheimer's disease Mother   . Throat cancer Brother   . Colon cancer Neg Hx   . Colon polyps Neg Hx   . Inflammatory bowel disease Neg Hx     Social History   Socioeconomic History  . Marital status: Married    Spouse name: Azaryah Heathcock  Occupational History  .  Occupation: Retired  Tobacco Use  . Smoking status: Former    Current packs/day: 0.00    Average packs/day: 0.8 packs/day for 20.0 years (15.0 ttl pk-yrs)    Types: Cigarettes    Start date: 80    Quit date: 2012    Years since quitting: 13.5    Passive exposure: Past  . Smokeless tobacco: Never  Vaping Use  . Vaping status: Never Used  Substance and Sexual Activity  . Alcohol use: No    Alcohol/week: 0.0 standard drinks of alcohol  . Drug use: No  . Sexual activity: Defer    Partners: Male   Social Drivers of Health   Financial Resource Strain: Low Risk  (06/16/2024)   Overall Financial Resource Strain (CARDIA)   . Difficulty of Paying Living Expenses: Not hard at all  Food Insecurity: No Food Insecurity (06/16/2024)   Hunger Vital Sign   . Worried About Programme researcher, broadcasting/film/video in the Last Year: Never true   . Ran Out of Food in the Last Year: Never true  Transportation Needs: No Transportation Needs (06/16/2024)   PRAPARE - Transportation   . Lack of Transportation (Medical): No   . Lack of Transportation (Non-Medical): No  Housing Stability: Low Risk  (06/16/2024)   Housing Stability Vital Sign   . Unable to Pay for  Housing in the Last Year: No   . Number of Times Moved in the Last Year: 0   . Homeless in the Last Year: No     Current Outpatient Medications  Medication Sig Dispense Refill  . acetaminophen  (TYLENOL ) 500 MG tablet Take by mouth    . aspirin  81 MG EC tablet Take by mouth once daily    . beta-carotene,A,-vits C,E/mins (VISION FORMULA ORAL) Take by mouth One daily    . cholecalciferol (VITAMIN D3) 2,000 unit capsule Take 2,000 Units by mouth once daily.    . fluticasone propion-salmeteroL (ADVAIR DISKUS) 250-50 mcg/dose diskus inhaler Inhale 1 Puff into the lungs every 12 (twelve) hours 60 each 3  . fluticasone propionate (FLONASE) 50 mcg/actuation nasal spray Place 1 spray into both nostrils 2 (two) times daily as needed 16 g 3  . HYDROcodone -acetaminophen   (NORCO) 5-325 mg tablet Take 1 tablet by mouth every 6 (six) hours as needed for Pain for up to 30 doses 30 tablet 0  . levothyroxine (SYNTHROID) 100 MCG tablet TAKE 1 TABLET BY MOUTH ONCE DAILY ON AN EMPTY STOMACH WITH A GLASS OF WATER AT LEAST 30-60 MINUTES BEFORE BREAKFAST 90 tablet 0  . loratadine (CLARITIN) 10 mg tablet Take 1 tablet (10 mg total) by mouth once daily as needed 60 tablet 1  . multivitamin tablet Take 1 tablet by mouth once daily    . nystatin (MYCOSTATIN) 100,000 unit/gram powder Apply topically 2 (two) times daily 60 g 1  . pravastatin (PRAVACHOL) 40 MG tablet TAKE 1 TABLET BY MOUTH AT BEDTIME 90 tablet 0   No current facility-administered medications for this visit.    Allergies  Allergen Reactions  . Tramadol Itching  . Hycodan [Hydrocodone -Homatropine] Other (See Comments)    Ear Buzzing  . Hydrocodone  Itching    Review of Systems: A 10+ ROS was performed, reviewed, and the pertinent orthopaedic findings are documented in the HPI.  I have reviewed and agree with the ROS captured by the CMA.    Physical Exam: There were no vitals filed for this visit. General/Constitutional: NAD, conversant Eyes: Pupils equal and round, extraocular movements intact ENT: atraumatic external nose and ears, moist mucous membranes Respiratory: non-labored breathing, symmetric chest rise, chest sounds clear. Cardiovascular: no visible lower extremity edema, peripheral pulses present, regular rate and rhythm  Skin: normal skin turgor, warm and dry Neurological: cranial nerves grossly intact, sensation grossly intact Psychological:  Appropriate mood and affect; appropriate judgment Musculoskeletal: as detailed below:  Comprehensive Shoulder Exam:  ROM   Right Left  Active (Passive) Forward Elevation  150 NT  ER 60 NT  IR    90 degree abduction ER/IR    Crepitus None None  Capsulitis Negative Negative                                                                                        Tenderness                                         Right Left  Inspection   Right Left  Skin Normal Bruising about arm, elbow and forearm.  No significant abnormality about region of deltopectoral incision  Scapular Kinetics    Atrophy      Impingement / Rotator Cuff   Right Left  Neer Impingement    Hawkins    Champagne Toast     Empty Can/Jobe's    ER Strength    Bear Hug    Belly Press    Hornblower's    ER Lag      AC / Biceps  / SLAP   Right Left  Speed's    Yergason's    O'Brien's    Cross Arm Adduction      Neurovascular   Right Left  Distal Motor Normal Normal, intact posterior deltoid strength  Distal Sensation Normal Normal, intact axillary nerve sensation  Distal Pulse Normal Normal    Imaging:  L Shoulder radiographs:  06/12/2024: There is at least a three-part proximal humerus fracture with humeral head displaced into complete varus.  Large greater tuberosity fragment is also significantly displaced.  Mild degenerative changes to the acromioclavicular joint present.  L Shoulder CT: FINDINGS: Bones/Joint/Cartilage   Comminuted, impacted, and displaced fracture of the left proximal humerus at the level of the surgical neck. Fracture margins extend through the greater and lesser tuberosities. There is approximately 2.7 cm of impaction and 2 cm of anterior displacement of the humeral shaft fracture component. Fracture margins extend to the articular surface of the humeral head which is inferiorly subluxed/dislocated relative to the glenoid.   No additional fracture identified. The glenoid is intact. The acromioclavicular joint is anatomically aligned with mild joint space narrowing.   Ligaments   Ligaments are suboptimally evaluated by CT.   Muscles and Tendons Muscles are normal. No muscle atrophy. No intramuscular fluid collection or hematoma.   Soft tissue No fluid collection or hematoma. No enlarged lymph nodes  identified in the field of view. Pleuroparenchymal scarring at the left lung apex. Aortic atherosclerosis.   IMPRESSION: 1. Comminuted, impacted, and displaced fracture of the left proximal humerus at the level of the surgical neck with intra-articular extension. Fracture margins extend through the greater and lesser tuberosities. The humeral head is inferiorly subluxed/dislocated relative to the glenoid. 2. No additional fracture identified. 3. Mild osteoarthritis of the acromioclavicular joint   I personally reviewed and visualized the aforementioned imaging studies. I additionally personally interpreted any radiographs taken during today's visit.   Assessment & Plan: Diagnoses and all orders for this visit:  Closed 3-part fracture of proximal end of left humerus, initial encounter     WILLARD MADRIGAL is a 78 y.o. female patient with L three-part proximal humerus fracture with head split component. 1.  We discussed the various treatment options including both further conservative management as well as surgical intervention.  Given the significant fracture displacement and head split component, we agreed that the patient may have a significantly better result with surgical intervention.  After discussion of the risks, benefits, and alternatives to surgery, the patient elected to proceed with Left reverse shoulder arthroplasty and biceps tenodesis.  Preferred surgical date is 06/17/24 2.  Slingshot 2 shoulder immobilizer was ordered, dispensed, fitted, and applied today in the office today to apply stabilization to the shoulder as seen in assessment above.  This immobilizer is medically necessary to protect the surgical repair and since the patient will be non-weight bearing. 3.  Follow-up ~2 weeks after surgery with repeat radiographs.

## 2024-06-16 NOTE — Patient Instructions (Signed)
 Your procedure is scheduled on:06-17-24 Tuesday Report to the Registration Desk on the 1st floor of the [facility]. Then proceed to the 2nd floor Surgery Desk To find out your arrival time, please call [phone] between 1PM - 3PM on:06-16-24 Monday If your arrival time is 6:00 am, do not arrive before that time as the [facility] entrance doors do not open until 6:00 am.  REMEMBER: Instructions that are not followed completely may result in serious medical risk, up to and including death; or upon the discretion of your surgeon and anesthesiologist your surgery may need to be rescheduled.  Do not eat food after midnight the night before surgery.  No gum chewing or hard candies.  You may however, drink CLEAR liquids up to 2 hours before you are scheduled to arrive for your surgery. Do not drink anything within 2 hours of your scheduled arrival time.  Clear liquids include: - water  - apple juice without pulp - gatorade (not RED colors) - black coffee or tea (Do NOT add milk or creamers to the coffee or tea) Do NOT drink anything that is not on this list.  In addition, your doctor has ordered for you to drink the provided:  Ensure Pre-Surgery Clear Carbohydrate Drink  Drinking this carbohydrate drink up to two hours before surgery helps to reduce insulin resistance and improve patient outcomes. Please complete drinking 2 hours before scheduled arrival time.  One week prior to surgery: Stop Anti-inflammatories (NSAIDS) such as Advil, Aleve, Ibuprofen, Motrin, Naproxen, Naprosyn and Aspirin  based products such as Excedrin, Goody's Powder, BC Powder.  You may however, continue to take Tylenol  if needed for pain up until the day of surgery.  Last dose of 81 mg Aspirin  was on 06-12-24 Thursday  Continue taking all of your other prescription medications up until the day of surgery.  ON THE DAY OF SURGERY ONLY TAKE THESE MEDICATIONS WITH SIPS OF WATER: -levothyroxine (SYNTHROID)  -You may  take HYDROcodone -acetaminophen  (NORCO) if needed for pain  No Alcohol for 24 hours before or after surgery.  No Smoking including e-cigarettes for 24 hours before surgery.  No chewable tobacco products for at least 6 hours before surgery.  No nicotine patches on the day of surgery.  Do not use any recreational drugs for at least a week (preferably 2 weeks) before your surgery.  Please be advised that the combination of cocaine and anesthesia may have negative outcomes, up to and including death. If you test positive for cocaine, your surgery will be cancelled.  On the morning of surgery brush your teeth with toothpaste and water, you may rinse your mouth with mouthwash if you wish. Do not swallow any toothpaste or mouthwash.  Use CHG wipes as directed on instruction sheet.  Do not wear jewelry, make-up, hairpins, clips or nail polish.  For welded (permanent) jewelry: bracelets, anklets, waist bands, etc.  Please have this removed prior to surgery.  If it is not removed, there is a chance that hospital personnel will need to cut it off on the day of surgery.  Do not wear lotions, powders, or perfumes.   Do not shave body hair from the neck down 48 hours before surgery.  Contact lenses, hearing aids and dentures may not be worn into surgery.  Do not bring valuables to the hospital. [facility] is not responsible for any missing/lost belongings or valuables.   Notify your doctor if there is any change in your medical condition (cold, fever, infection).  Wear comfortable  clothing (specific to your surgery type) to the hospital.  After surgery, you can help prevent lung complications by doing breathing exercises.  Take deep breaths and cough every 1-2 hours. Your doctor may order a device called an Incentive Spirometer to help you take deep breaths. When coughing or sneezing, hold a pillow firmly against your incision with both hands. This is called "splinting." Doing this helps  protect your incision. It also decreases belly discomfort.  If you are being admitted to the hospital overnight, leave your suitcase in the car. After surgery it may be brought to your room.  In case of increased patient census, it may be necessary for you, the patient, to continue your postoperative care in the Same Day Surgery department.  If you are being discharged the day of surgery, you will not be allowed to drive home. You will need a responsible individual to drive you home and stay with you for 24 hours after surgery.   If you are taking public transportation, you will need to have a responsible individual with you.  Please call the Pre-admissions Testing Dept. at [phone] if you have any questions about these instructions.  Surgery Visitation Policy:  Patients having surgery or a procedure may have two visitors.  Children under the age of 15 must have an adult with them who is not the patient.  Inpatient Visitation:    Visiting hours are 7 a.m. to 8 p.m. Up to four visitors are allowed at one time in a patient room. The visitors may rotate out with other people during the day.  One visitor age 8 or older may stay with the patient overnight and must be in the room by 8 p.m.  Preparing the Skin Before Surgery     To help prevent the risk of infection at your surgical site, we are now providing you with rinse-free Sage 2% Chlorhexidine  Gluconate (CHG) disposable wipes.  Chlorhexidine  Gluconate (CHG) Soap  o An antiseptic cleaner that kills germs and bonds with the skin to continue killing germs even after washing  o Used for showering the night before surgery and morning of surgery  The night before surgery: Shower or bathe with warm water. Do not apply perfume, lotions, powders. Wait one hour after shower. Skin should be dry and cool. Open Sage wipe package - use 6 disposable cloths. Wipe body using one cloth for the right arm, one cloth for the left arm, one  cloth for the right leg, one cloth for the left leg, one cloth for the chest/abdomen area, and one cloth for the back. Do not use on open wounds or sores. Do not use on face or genitals (private parts). If you are breast feeding, do not use on breasts. 5. Do not rinse, allow to dry. 6. Skin may feel tacky for several minutes. 7. Dress in clean clothes. 8. Place clean sheets on your bed and do not sleep with pets.  REPEAT ABOVE ON THE MORNING OF SURGERY BEFORE ARRIVING TO THE HOSPITAL.  How to Use an Incentive Spirometer An incentive spirometer is a tool that measures how well you are filling your lungs with each breath. Learning to take long, deep breaths using this tool can help you keep your lungs clear and active. This may help to reverse or lessen your chance of developing breathing (pulmonary) problems, especially infection. You may be asked to use a spirometer: After a surgery. If you have a lung problem or a history of smoking. After a  long period of time when you have been unable to move or be active. If the spirometer includes an indicator to show the highest number that you have reached, your health care provider or respiratory therapist will help you set a goal. Keep a log of your progress as told by your health care provider. What are the risks? Breathing too quickly may cause dizziness or cause you to pass out. Take your time so you do not get dizzy or light-headed. If you are in pain, you may need to take pain medicine before doing incentive spirometry. It is harder to take a deep breath if you are having pain. How to use your incentive spirometer  Sit up on the edge of your bed or on a chair. Hold the incentive spirometer so that it is in an upright position. Before you use the spirometer, breathe out normally. Place the mouthpiece in your mouth. Make sure your lips are closed tightly around it. Breathe in slowly and as deeply as you can through your mouth, causing the  piston or the ball to rise toward the top of the chamber. Hold your breath for 3-5 seconds, or for as long as possible. If the spirometer includes a coach indicator, use this to guide you in breathing. Slow down your breathing if the indicator goes above the marked areas. Remove the mouthpiece from your mouth and breathe out normally. The piston or ball will return to the bottom of the chamber. Rest for a few seconds, then repeat the steps 10 or more times. Take your time and take a few normal breaths between deep breaths so that you do not get dizzy or light-headed. Do this every 1-2 hours when you are awake. If the spirometer includes a goal marker to show the highest number you have reached (best effort), use this as a goal to work toward during each repetition. After each set of 10 deep breaths, cough a few times. This will help to make sure that your lungs are clear. If you have an incision on your chest or abdomen from surgery, place a pillow or a rolled-up towel firmly against the incision when you cough. This can help to reduce pain while taking deep breaths and coughing. General tips When you are able to get out of bed: Walk around often. Continue to take deep breaths and cough in order to clear your lungs. Keep using the incentive spirometer until your health care provider says it is okay to stop using it. If you have been in the hospital, you may be told to keep using the spirometer at home. Contact a health care provider if: You are having difficulty using the spirometer. You have trouble using the spirometer as often as instructed. Your pain medicine is not giving enough relief for you to use the spirometer as told. You have a fever. Get help right away if: You develop shortness of breath. You develop a cough with bloody mucus from the lungs. You have fluid or blood coming from an incision site after you cough. Summary An incentive spirometer is a tool that can help you learn to  take long, deep breaths to keep your lungs clear and active. You may be asked to use a spirometer after a surgery, if you have a lung problem or a history of smoking, or if you have been inactive for a long period of time. Use your incentive spirometer as instructed every 1-2 hours while you are awake. If you have an incision on  your chest or abdomen, place a pillow or a rolled-up towel firmly against your incision when you cough. This will help to reduce pain. Get help right away if you have shortness of breath, you cough up bloody mucus, or blood comes from your incision when you cough. This information is not intended to replace advice given to you by your health care provider. Make sure you discuss any questions you have with your health care provider. Document Revised: 10/05/2023 Document Reviewed: 10/05/2023 Elsevier Patient Education  2024 [employer].      [employer] Directory to address health-related social needs:  [email or web address]

## 2024-06-16 NOTE — Progress Notes (Addendum)
 Called pt to do anesthesia interview for upcoming total shoulder on 06-17-24. Ended up having to do interview with husband due to pt being hard of hearing. Informed Todd (husband) that pt needs to come in today 06-16-24 for T&S, EKG mrsa and ua. Pt in the back ground saying that she is pain and she is not coming in to us  for anything today and that she will do it all when she comes in for her surgery tomorrow. Pt states that she was at the [facility] this am to see Tobie and that she is not getting back out agan. I called Johnye Manes RN to inform her of all of this and she states that pt is scheduled for 1st case in am and will need to be moved to his 2nd case. Lissa states that they will still get pt to come at 0600 so they can get everything done and back in a timely manner so surgery will not be delayed. I called and spoke with Allyson (surgery scheduler) at [facility] office and she states that she will move pt to 2nd case and let Tobie know pt is refusing to come and get labwork that is needed prior to surgery

## 2024-06-17 ENCOUNTER — Encounter
Admission: RE | Disposition: A | Discharge status: Home / Self Care | Payer: Self-pay | Source: Home / Self Care | Attending: Orthopedic Surgery

## 2024-06-17 ENCOUNTER — Ambulatory Visit: Admitting: Certified Registered"

## 2024-06-17 ENCOUNTER — Encounter: Payer: Self-pay | Admitting: Urgent Care

## 2024-06-17 ENCOUNTER — Ambulatory Visit

## 2024-06-17 ENCOUNTER — Encounter: Payer: Self-pay | Admitting: Orthopedic Surgery

## 2024-06-17 ENCOUNTER — Other Ambulatory Visit: Payer: Self-pay

## 2024-06-17 ENCOUNTER — Ambulatory Visit
Admission: RE | Admit: 2024-06-17 | Discharge: 2024-06-17 | Disposition: A | Discharge status: Home / Self Care | Attending: Orthopedic Surgery | Admitting: Orthopedic Surgery

## 2024-06-17 DIAGNOSIS — Z87891 Personal history of nicotine dependence: Secondary | ICD-10-CM | POA: Diagnosis not present

## 2024-06-17 DIAGNOSIS — X58XXXA Exposure to other specified factors, initial encounter: Secondary | ICD-10-CM | POA: Diagnosis not present

## 2024-06-17 DIAGNOSIS — M25512 Pain in left shoulder: Secondary | ICD-10-CM | POA: Diagnosis present

## 2024-06-17 DIAGNOSIS — S42232A 3-part fracture of surgical neck of left humerus, initial encounter for closed fracture: Secondary | ICD-10-CM | POA: Diagnosis not present

## 2024-06-17 DIAGNOSIS — Z01818 Encounter for other preprocedural examination: Secondary | ICD-10-CM

## 2024-06-17 HISTORY — PX: REVERSE SHOULDER ARTHROPLASTY: SHX5054

## 2024-06-17 HISTORY — PX: BICEPT TENODESIS: SHX5116

## 2024-06-17 LAB — TYPE AND SCREEN
ABO/RH(D): O POS
Antibody Screen: NEGATIVE

## 2024-06-17 LAB — SURGICAL PCR SCREEN
MRSA, PCR: NEGATIVE
Staphylococcus aureus: NEGATIVE

## 2024-06-17 SURGERY — ARTHROPLASTY, SHOULDER, TOTAL, REVERSE
Anesthesia: General | Site: Shoulder | Laterality: Left

## 2024-06-17 MED ORDER — CEFAZOLIN SODIUM-DEXTROSE 2-4 GM/100ML-% IV SOLN: 2.0000 g | Freq: Once | INTRAVENOUS | Status: DC

## 2024-06-17 MED ORDER — PHENYLEPHRINE 80 MCG/ML (10ML) SYRINGE FOR IV PUSH (FOR BLOOD PRESSURE SUPPORT)
PREFILLED_SYRINGE | INTRAVENOUS | Status: DC | PRN
Start: 2024-06-17 — End: 2024-06-17
  Administered 2024-06-17 (×4): 80 ug via INTRAVENOUS
  Administered 2024-06-17: 160 ug via INTRAVENOUS
  Administered 2024-06-17: 80 ug via INTRAVENOUS

## 2024-06-17 MED ORDER — EPHEDRINE SULFATE-NACL 50-0.9 MG/10ML-% IV SOSY
PREFILLED_SYRINGE | INTRAVENOUS | Status: DC | PRN
Start: 2024-06-17 — End: 2024-06-17
  Administered 2024-06-17: 5 mg via INTRAVENOUS
  Administered 2024-06-17: 10 mg via INTRAVENOUS
  Administered 2024-06-17: 5 mg via INTRAVENOUS

## 2024-06-17 MED ORDER — ROCURONIUM BROMIDE 10 MG/ML (PF) SYRINGE
PREFILLED_SYRINGE | INTRAVENOUS | Status: DC | PRN
Start: 2024-06-17 — End: 2024-06-17
  Administered 2024-06-17: 60 mg via INTRAVENOUS

## 2024-06-17 MED ORDER — MIDAZOLAM HCL 2 MG/2ML IJ SOLN
INTRAMUSCULAR | Status: AC
Start: 2024-06-17 — End: 2024-06-17
  Filled 2024-06-17: qty 2

## 2024-06-17 MED ORDER — BUPIVACAINE LIPOSOME 1.3 % IJ SUSP
INTRAMUSCULAR | Status: DC | PRN
  Administered 2024-06-17: 20 mL

## 2024-06-17 MED ORDER — PROPOFOL 500 MG/50ML IV EMUL
INTRAVENOUS | Status: DC | PRN
  Administered 2024-06-17: 20 ug/kg/min via INTRAVENOUS

## 2024-06-17 MED ORDER — PHENYLEPHRINE HCL-NACL 20-0.9 MG/250ML-% IV SOLN
INTRAVENOUS | Status: DC | PRN
  Administered 2024-06-17: 30 ug/min via INTRAVENOUS

## 2024-06-17 MED ORDER — VANCOMYCIN HCL 1000 MG IV SOLR
INTRAVENOUS | Status: DC | PRN
  Administered 2024-06-17: 1000 mg via TOPICAL

## 2024-06-17 MED ORDER — CEFAZOLIN SODIUM-DEXTROSE 2-4 GM/100ML-% IV SOLN
INTRAVENOUS | Status: AC
Start: 2024-06-17 — End: 2024-06-17
  Filled 2024-06-17: qty 100

## 2024-06-17 MED ORDER — ALBUMIN HUMAN 5 % IV SOLN
INTRAVENOUS | Status: AC
  Filled 2024-06-17: qty 250

## 2024-06-17 MED ORDER — FENTANYL CITRATE (PF) 100 MCG/2ML IJ SOLN: 25.0000 ug | INTRAMUSCULAR | Status: DC | PRN

## 2024-06-17 MED ORDER — BUPIVACAINE HCL (PF) 0.5 % IJ SOLN
INTRAMUSCULAR | Status: DC | PRN
  Administered 2024-06-17: 10 mL

## 2024-06-17 MED ORDER — SODIUM CHLORIDE 0.9 % IV SOLN: INTRAVENOUS | Status: DC | PRN

## 2024-06-17 MED ORDER — OXYCODONE HCL 5 MG PO TABS: 5.0000 mg | ORAL_TABLET | ORAL | 0 refills | Status: AC | PRN

## 2024-06-17 MED ORDER — MIDAZOLAM HCL 2 MG/2ML IJ SOLN
1.0000 mg | Freq: Once | INTRAMUSCULAR | Status: AC
  Administered 2024-06-17: 1 mg via INTRAVENOUS

## 2024-06-17 MED ORDER — SUGAMMADEX SODIUM 200 MG/2ML IV SOLN
INTRAVENOUS | Status: DC | PRN
Start: 2024-06-17 — End: 2024-06-17
  Administered 2024-06-17: 200 mg via INTRAVENOUS

## 2024-06-17 MED ORDER — CEFAZOLIN SODIUM-DEXTROSE 2-4 GM/100ML-% IV SOLN
2.0000 g | Freq: Once | INTRAVENOUS | Status: AC
  Administered 2024-06-17: 2 g via INTRAVENOUS

## 2024-06-17 MED ORDER — PHENYLEPHRINE HCL-NACL 20-0.9 MG/250ML-% IV SOLN
INTRAVENOUS | Status: AC
  Filled 2024-06-17: qty 250

## 2024-06-17 MED ORDER — TRANEXAMIC ACID-NACL 1000-0.7 MG/100ML-% IV SOLN
1000.0000 mg | Freq: Once | INTRAVENOUS | Status: AC
  Administered 2024-06-17: 1000 mg via INTRAVENOUS

## 2024-06-17 MED ORDER — VANCOMYCIN HCL 1000 MG IV SOLR
INTRAVENOUS | Status: AC
  Filled 2024-06-17: qty 20

## 2024-06-17 MED ORDER — TRANEXAMIC ACID-NACL 1000-0.7 MG/100ML-% IV SOLN
1000.0000 mg | INTRAVENOUS | Status: AC
  Administered 2024-06-17: 1000 mg via INTRAVENOUS

## 2024-06-17 MED ORDER — ACETAMINOPHEN 500 MG PO TABS
1000.0000 mg | ORAL_TABLET | Freq: Three times a day (TID) | ORAL | 2 refills | Status: AC
Start: 2024-06-17 — End: 2025-06-17

## 2024-06-17 MED ORDER — ONDANSETRON HCL 4 MG/2ML IJ SOLN
INTRAMUSCULAR | Status: DC | PRN
Start: 2024-06-17 — End: 2024-06-17
  Administered 2024-06-17: 4 mg via INTRAVENOUS

## 2024-06-17 MED ORDER — BUPIVACAINE HCL (PF) 0.5 % IJ SOLN
INTRAMUSCULAR | Status: AC
  Filled 2024-06-17: qty 10

## 2024-06-17 MED ORDER — ONDANSETRON 4 MG PO TBDP: 4.0000 mg | ORAL_TABLET | Freq: Three times a day (TID) | ORAL | 0 refills | Status: AC | PRN

## 2024-06-17 MED ORDER — FENTANYL CITRATE (PF) 100 MCG/2ML IJ SOLN
INTRAMUSCULAR | Status: AC
  Filled 2024-06-17: qty 2

## 2024-06-17 MED ORDER — BUPIVACAINE LIPOSOME 1.3 % IJ SUSP
INTRAMUSCULAR | Status: AC
  Filled 2024-06-17: qty 20

## 2024-06-17 MED ORDER — TRANEXAMIC ACID-NACL 1000-0.7 MG/100ML-% IV SOLN: 1000.0000 mg | INTRAVENOUS | Status: DC

## 2024-06-17 MED ORDER — FENTANYL CITRATE PF 50 MCG/ML IJ SOSY
50.0000 ug | PREFILLED_SYRINGE | Freq: Once | INTRAMUSCULAR | Status: AC
  Administered 2024-06-17: 50 ug via INTRAVENOUS

## 2024-06-17 MED ORDER — FENTANYL CITRATE (PF) 100 MCG/2ML IJ SOLN
INTRAMUSCULAR | Status: DC | PRN
  Administered 2024-06-17: 50 ug via INTRAVENOUS
  Administered 2024-06-17: 25 ug via INTRAVENOUS

## 2024-06-17 MED ORDER — ASPIRIN 325 MG PO TBEC: 325.0000 mg | DELAYED_RELEASE_TABLET | Freq: Every day | ORAL | 0 refills | Status: AC

## 2024-06-17 MED ORDER — TRANEXAMIC ACID-NACL 1000-0.7 MG/100ML-% IV SOLN
INTRAVENOUS | Status: AC
Start: 2024-06-17 — End: 2024-06-17
  Filled 2024-06-17: qty 100

## 2024-06-17 MED ORDER — LACTATED RINGERS IV SOLN: INTRAVENOUS | Status: DC

## 2024-06-17 MED ORDER — FENTANYL CITRATE PF 50 MCG/ML IJ SOSY
PREFILLED_SYRINGE | INTRAMUSCULAR | Status: AC
  Filled 2024-06-17: qty 1

## 2024-06-17 MED ORDER — PROPOFOL 10 MG/ML IV BOLUS
INTRAVENOUS | Status: DC | PRN
  Administered 2024-06-17: 150 mg via INTRAVENOUS

## 2024-06-17 MED ORDER — ALBUMIN HUMAN 5 % IV SOLN: INTRAVENOUS | Status: DC | PRN

## 2024-06-17 MED ORDER — 0.9 % SODIUM CHLORIDE (POUR BTL) OPTIME
TOPICAL | Status: DC | PRN
  Administered 2024-06-17: 500 mL

## 2024-06-17 MED ORDER — OXYCODONE HCL 5 MG PO TABS: 5.0000 mg | ORAL_TABLET | Freq: Once | ORAL | Status: DC | PRN

## 2024-06-17 MED ORDER — ORAL CARE MOUTH RINSE: 15.0000 mL | Freq: Once | OROMUCOSAL | Status: AC

## 2024-06-17 MED ORDER — CHLORHEXIDINE GLUCONATE 0.12 % MT SOLN
15.0000 mL | Freq: Once | OROMUCOSAL | Status: AC
  Administered 2024-06-17: 15 mL via OROMUCOSAL

## 2024-06-17 MED ORDER — DEXAMETHASONE SODIUM PHOSPHATE 10 MG/ML IJ SOLN
INTRAMUSCULAR | Status: DC | PRN
Start: 2024-06-17 — End: 2024-06-17
  Administered 2024-06-17: 5 mg via INTRAVENOUS

## 2024-06-17 MED ORDER — CEFAZOLIN SODIUM-DEXTROSE 2-4 GM/100ML-% IV SOLN
2.0000 g | INTRAVENOUS | Status: AC
  Administered 2024-06-17: 2 g via INTRAVENOUS

## 2024-06-17 MED ORDER — SODIUM CHLORIDE 0.9 % IR SOLN
Status: DC | PRN
  Administered 2024-06-17: 3000 mL

## 2024-06-17 MED ORDER — OXYCODONE HCL 5 MG/5ML PO SOLN: 5.0000 mg | Freq: Once | ORAL | Status: DC | PRN

## 2024-06-17 SURGICAL SUPPLY — 66 items
BIT DRILL 12.7X2STRG SHNK (BIT) IMPLANT
BIT DRILL 3MM FOR 4.5 SCREW (BIT) IMPLANT
BLADE SAGITTAL WIDE XTHICK NO (BLADE) ×1 IMPLANT
CHLORAPREP W/TINT 26 (MISCELLANEOUS) ×1 IMPLANT
COOLER POLAR GLACIER W/PUMP (MISCELLANEOUS) ×1 IMPLANT
DERMABOND ADVANCED .7 DNX12 (GAUZE/BANDAGES/DRESSINGS) IMPLANT
DRAPE INCISE IOBAN 66X45 STRL (DRAPES) ×1 IMPLANT
DRAPE SHEET LG 3/4 BI-LAMINATE (DRAPES) ×1 IMPLANT
DRAPE TABLE BACK 80X90 (DRAPES) ×1 IMPLANT
DRSG OPSITE POSTOP 4X8 (GAUZE/BANDAGES/DRESSINGS) ×1 IMPLANT
DRSG TEGADERM 2-3/8X2-3/4 SM (GAUZE/BANDAGES/DRESSINGS) ×1 IMPLANT
ELECTRODE REM PT RTRN 9FT ADLT (ELECTROSURGICAL) ×1 IMPLANT
EVACUATOR 1/8 PVC DRAIN (DRAIN) ×1 IMPLANT
GAUZE SPONGE 2X2 STRL 8-PLY (GAUZE/BANDAGES/DRESSINGS) ×1 IMPLANT
GAUZE XEROFORM 1X8 LF (GAUZE/BANDAGES/DRESSINGS) IMPLANT
GLENOID BSPLAT 10D RSS AETOS (Plate) IMPLANT
GLENOSPHERE CON AETOS 38 RSS (Shoulder) IMPLANT
GLOVE BIOGEL PI IND STRL 8 (GLOVE) ×2 IMPLANT
GLOVE PI ULTRA LF STRL 7.5 (GLOVE) ×2 IMPLANT
GLOVE SURG ORTHO 8.0 STRL STRW (GLOVE) ×2 IMPLANT
GLOVE SURG SYN 8.0 (GLOVE) ×1 IMPLANT
GLOVE SURG SYN 8.0 PF PI (GLOVE) ×1 IMPLANT
GOWN SRG LRG LVL 4 IMPRV REINF (GOWNS) ×2 IMPLANT
GOWN SRG XL LONG LVL 3 NONREIN (GOWNS) ×1 IMPLANT
GUIDEWIRE GLENOID 2.5X220 (WIRE) IMPLANT
HOOD PEEL AWAY T7 (MISCELLANEOUS) ×3 IMPLANT
KIT STABILIZATION SHOULDER (MISCELLANEOUS) ×1 IMPLANT
LAVAGE JET IRRISEPT WOUND (IRRIGATION / IRRIGATOR) IMPLANT
LINER STD +6S RSS HXL (Liner) IMPLANT
MANIFOLD NEPTUNE II (INSTRUMENTS) ×1 IMPLANT
MASK FACE SPIDER DISP (MASK) ×1 IMPLANT
MAT ABSORB FLUID 56X50 GRAY (MISCELLANEOUS) ×1 IMPLANT
NDL REVERSE CUT 1/2 CRC (NEEDLE) IMPLANT
NDL SPNL 20GX3.5 QUINCKE YW (NEEDLE) IMPLANT
NEEDLE REVERSE CUT 1/2 CRC (NEEDLE) ×1 IMPLANT
NEEDLE SPNL 20GX3.5 QUINCKE YW (NEEDLE) IMPLANT
NS IRRIG 500ML POUR BTL (IV SOLUTION) ×1 IMPLANT
PACK ARTHROSCOPY SHOULDER (MISCELLANEOUS) ×1 IMPLANT
PAD WRAPON POLAR SHDR XLG (MISCELLANEOUS) ×1 IMPLANT
PENCIL SMOKE EVACUATOR (MISCELLANEOUS) ×1 IMPLANT
PIN GUIDE GLENOID 2.5 NT (PIN) IMPLANT
SCREW BODY REVERSE LRG (Screw) IMPLANT
SCREW CENTER 4.5X25 RSS (Screw) IMPLANT
SCREW CENTER 4.5X30 RSS (Screw) IMPLANT
SCREW LOCK 4.5X15 RSS (Screw) IMPLANT
SCREW LOCK 4.5X30 RSS (Screw) IMPLANT
SCREW LOCKING 4.5X30 RSS (Screw) IMPLANT
SOL .9 NS 3000ML IRR UROMATIC (IV SOLUTION) ×1 IMPLANT
SPONGE T-LAP 18X18 ~~LOC~~+RFID (SPONGE) ×2 IMPLANT
STAPLER SKIN PROX 35W (STAPLE) IMPLANT
STEM PRESS FIT SZ 07 TSS (Stem) IMPLANT
STRAP SAFETY 5IN WIDE (MISCELLANEOUS) ×1 IMPLANT
SUT MNCRL AB 4-0 PS2 18 (SUTURE) IMPLANT
SUT PROLENE 6 0 P 1 18 (SUTURE) IMPLANT
SUT TICRON 2-0 30IN 311381 (SUTURE) ×2 IMPLANT
SUT VIC AB 0 CT1 36 (SUTURE) ×1 IMPLANT
SUT VIC AB 2-0 CT2 27 (SUTURE) ×2 IMPLANT
SUT XBRAID 1.4 BLK/WHT (SUTURE) IMPLANT
SUT XBRAID 1.4 BLUE (SUTURE) IMPLANT
SUT XBRAID 1.4 WHITE/BLUE (SUTURE) IMPLANT
SUT XBRAID 2 BLACK/BLUE (SUTURE) IMPLANT
SUTURE ETHBND 5-0 MS/4 CCS GRN (SUTURE) ×1 IMPLANT
SUTURE FIBERWR #2 38 BLUE 1/2 (SUTURE) ×4 IMPLANT
TIP FAN IRRIG PULSAVAC PLUS (DISPOSABLE) ×1 IMPLANT
TRAP FLUID SMOKE EVACUATOR (MISCELLANEOUS) ×1 IMPLANT
WATER STERILE IRR 1000ML POUR (IV SOLUTION) ×1 IMPLANT

## 2024-06-17 NOTE — Anesthesia Preprocedure Evaluation (Signed)
 Anesthesia Evaluation  Patient identified by MRN, date of birth, ID band Patient awake    Reviewed: Allergy & Precautions, NPO status , Patient's Chart, lab work & pertinent test results  History of Anesthesia Complications Negative for: history of anesthetic complications  Airway Mallampati: II  TM Distance: >3 FB Neck ROM: Full    Dental  (+) Poor Dentition   Pulmonary asthma , neg sleep apnea, former smoker   breath sounds clear to auscultation- rhonchi (-) wheezing      Cardiovascular Exercise Tolerance: Good (-) hypertension(-) CAD, (-) Past MI, (-) Cardiac Stents and (-) CABG  Rhythm:Regular Rate:Normal - Systolic murmurs and - Diastolic murmurs    Neuro/Psych neg Seizures negative neurological ROS  negative psych ROS   GI/Hepatic negative GI ROS, Neg liver ROS,,,  Endo/Other  neg diabetesHypothyroidism    Renal/GU negative Renal ROS     Musculoskeletal  (+) Arthritis ,    Abdominal  (+) + obese  Peds  Hematology negative hematology ROS (+)   Anesthesia Other Findings Patient initially refused peripheral nerve block but after discussion with the surgeon, patient wants to do the block.   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     Comment:  saw Dr Parris in 2021  No date: Thyroid  disease   Reproductive/Obstetrics                              Anesthesia Physical Anesthesia Plan  ASA: 2  Anesthesia Plan: General ETT   Post-op Pain Management: Regional block*   Induction: Intravenous  PONV Risk Score and Plan: 3 and Ondansetron , Dexamethasone  and Midazolam   Airway Management Planned: Oral ETT  Additional Equipment:   Intra-op Plan:   Post-operative Plan:  Extubation in OR  Informed Consent: I have reviewed the patients History and Physical, chart, labs and discussed the procedure including the risks, benefits and alternatives for the proposed anesthesia with the patient or authorized representative who has indicated his/her understanding and acceptance.     Dental Advisory Given  Plan Discussed with: Anesthesiologist, CRNA and Surgeon  Anesthesia Plan Comments: (Patient consented for risks of anesthesia including but not limited to:  - adverse reactions to medications - damage to eyes, teeth, lips or other oral mucosa - nerve damage due to positioning  - sore throat or hoarseness - Damage to heart, brain, nerves, lungs, other parts of body or loss of life  Patient voiced understanding and assent.)         Anesthesia Quick Evaluation

## 2024-06-17 NOTE — Evaluation (Signed)
 Occupational Therapy Evaluation Patient Details Name: Elizabeth Bean MRN: [id number] DOB: [birth year] Today's Date: 06/17/2024   History of Present Illness   Pt is 78 y/o female s/p L reserve TSA.     Clinical Impressions Pt seated in recliner chair and agreeable to OT evaluation and education. Pt with supportive family present for hands on education and training. OT reviewed precautions, polar care system, hand/wrist/elbow exercises, and sling donning/doffing. Pt needing min A for LB dressing and balance to don pants. OT providing education and family assisted pt with donning UB clothing, polar care, and sling. Pt and family asking appropriate questions. All education completed. OT to complete orders at this time.      If plan is discharge home, recommend the following:   A little help with walking and/or transfers;A little help with bathing/dressing/bathroom;Assistance with cooking/housework;Help with stairs or ramp for entrance;Assist for transportation     Functional Status Assessment   Patient has had a recent decline in their functional status and demonstrates the ability to make significant improvements in function in a reasonable and predictable amount of time.     Equipment Recommendations   None recommended by OT      Precautions/Restrictions   Precautions Precautions: Fall;Shoulder Shoulder Interventions: Todd joy ultra sling;Shoulder abduction pillow;At all times;Off for dressing/bathing/exercises Precaution Booklet Issued: Yes (comment) Restrictions Weight Bearing Restrictions Per Provider Order: Yes LUE Weight Bearing Per Provider Order: Non weight bearing     Mobility Bed Mobility                    Transfers Overall transfer level: Needs assistance Equipment used: 1 person hand held assist Transfers: Sit to/from Stand Sit to Stand: Contact guard assist                  Balance Overall balance assessment: Needs  assistance Sitting-balance support: Feet supported Sitting balance-Leahy Scale: Good     Standing balance support: Single extremity supported Standing balance-Leahy Scale: Fair                             ADL either performed or assessed with clinical judgement   ADL Overall ADL's : Needs assistance/impaired                 Upper Body Dressing : Maximal assistance;Cueing for safety;Cueing for sequencing;Cueing for UE precautions;Sitting Upper Body Dressing Details (indicate cue type and reason): sling, polar care, and shirt with cues for new precautions Lower Body Dressing: Minimal assistance;Sit to/from stand                       Vision Baseline Vision/History: 1 Wears glasses Patient Visual Report: No change from baseline              Pertinent Vitals/Pain Pain Assessment Pain Assessment: No/denies pain     Extremity/Trunk Assessment Upper Extremity Assessment Upper Extremity Assessment: Right hand dominant;LUE deficits/detail LUE Deficits / Details: L UE not tested secondary to surgical location and NWB in sling   Lower Extremity Assessment Lower Extremity Assessment: Defer to PT evaluation       Communication Communication Communication: Impaired Factors Affecting Communication: Hearing impaired   Cognition Arousal: Alert Behavior During Therapy: WFL for tasks assessed/performed Cognition: No apparent impairments  Following commands: Intact       Cueing  General Comments   Cueing Techniques: Verbal cues              Home Living Family/patient expects to be discharged to:: Private residence Living Arrangements: Spouse/significant other Available Help at Discharge: Available 24 hours/day Type of Home: House Home Access: Stairs to enter [employer] of Steps: 3 Entrance Stairs-Rails: Right;Left Home Layout: One level     Bathroom Shower/Tub: Tub/shower unit          Home Equipment: Agricultural consultant (2 wheels);Rollator (4 wheels);Cane - single point;Shower seat          Prior Functioning/Environment Prior Level of Function : Independent/Modified Independent                            OT Goals(Current goals can be found in the care plan section)   Acute Rehab OT Goals Patient Stated Goal: to go home OT Goal Formulation: With patient/family Time For Goal Achievement: 06/17/24 Potential to Achieve Goals: Fair   AM-PAC OT 6 Clicks Daily Activity     Outcome Measure Help from another person eating meals?: A Little Help from another person taking care of personal grooming?: A Little Help from another person toileting, which includes using toliet, bedpan, or urinal?: A Little Help from another person bathing (including washing, rinsing, drying)?: A Little Help from another person to put on and taking off regular upper body clothing?: A Lot Help from another person to put on and taking off regular lower body clothing?: A Little 6 Click Score: 17   End of Session Nurse Communication: Mobility status  Activity Tolerance: Patient tolerated treatment well Patient left: in chair;with family/visitor present                   Time: 8384-8350 OT Time Calculation (min): 34 min Charges:  OT General Charges $OT Visit: 1 Visit OT Evaluation $OT Eval Low Complexity: 1 Low OT Treatments $Self Care/Home Management : 23-37 mins  Izetta Claude, MS, OTR/L , CBIS ascom [phone]  06/17/24, 5:00 PM

## 2024-06-17 NOTE — Op Note (Signed)
 Operative Note    SURGERY DATE: 06/17/2024   PRE-OP DIAGNOSIS:  1. Left 3-part proximal humerus fracture with head-splitting component   POST-OP DIAGNOSIS:  1. Left 3-part proximal humerus fracture with head-splitting component   PROCEDURES:  1. Left reverse total shoulder arthroplasty 2. Left biceps tenodesis   SURGEON: Earnestine HILARIO Blanch, MD  ASSISTANTS: Krystal Doyne, PA   ANESTHESIA: Gen + Exparel  interscalene block   ESTIMATED BLOOD LOSS: 300cc   TOTAL IV FLUIDS: see anesthesia record  IMPLANTS: S&N [employer] 10 deg Augmented baseplate w/central + 3 peripheral 4.74mm screws, 38mm concentric glenosphere; Titan Reverse Body Standard and Body Screw; TSS Press Fit Size 7 stem; +6 Standard Poly Liner  INDICATION(S):  Elizabeth Bean is 78 y.o. female who sustained a displaced 3-part proximal humerus fracture. After discussion of risks, benefits, and alternatives to surgery, the patient elected to proceed with reverse shoulder arthroplasty and biceps tenodesis.   OPERATIVE FINDINGS: 3-part proximal humerus fracture with head split, biceps tendinopathy   OPERATIVE REPORT:   I identified Rock DELENA Ranger in the pre-operative holding area. Informed consent was obtained and the surgical site was marked. I reviewed the risks and benefits of the proposed surgical intervention and the patient (and/or patient's guardian) wished to proceed. An interscalene block with Exparel  was administered by the Anesthesia team. The patient was transferred to the operative suite and general anesthesia was administered. The patient was placed in the beach chair position with the head of the bed elevated approximately 30 degrees. All down side pressure points were appropriately padded. Appropriate IV antibiotics were administered. Tranexamic acid  was also administered after verifying that the patient had no contraindications. The extremity was then prepped and draped in standard fashion. A time out was performed  confirming the correct extremity, correct patient, and correct procedure.   We used the standard deltopectoral incision from the coracoid to ~12cm distal.  Vancomycin  powder was placed in the dermis.  We found the cephalic vein and took it laterally. We opened the deltopectoral interval widely and placed retractors under the CA ligament in the subacromial space and under the deltoid tendon at its insertion. We then released the underlying bursa between these retractors, taking care not to damage the circumflex branch of the axillary nerve.   We opened the clavipectoral fascia lateral to the conjoint tendon.  The arm was then internally rotated, we cut the falciform ligament at approximately 1 cm of the upper portion of the pectoralis major insertion. Next we unroofed the bicipital groove. Biceps tendon was significantly erythematous and inflamed. We proceeded with a soft tissue biceps tenodesis given the pathology of the tendon.  After opening the biceps tendon sheath all the way to the supraglenoid tubercle, we performed a biceps tenodesis with two #2 TiCron sutures to the upper border of the pectoralis major. The proximal portion of the tendon was excised.   FiberWire sutures were placed within the supraspinatus, infraspinatus, and subscapularis to obtain appropriate control of the tuberosities. The articular humeral head portion was removed.  The supraspinatus tendon itself was excised.  The infraspinatus was noted to be attached to the greater tuberosity fragment.  Cancellous bone was removed from the removed humeral head to be used later as bone graft.  #5 Ethibond sutures were placed around the greater tuberosity at the bone-tendon junction.  Additionally, two  X-Braid suture were placed around the greater tuberosity (one to serve as tuberosity to fin sutures and one to serve as an around the world suture).  A posterior retractor was used to retract greater tuberosity and the humeral shaft, exposing  the glenoid.  The anterior capsule was separated from the subscapularis and excised from the lesser tuberosity to the glenoid.  The labrum and the remnant of the biceps tendon were removed in a circumferential fashion.  The attachment of the triceps muscle was released off of the inferior glenoid.  During the glenoid exposure, the axillary nerve was protected the entire time.  Appropriate retractors were placed and there was excellent glenoid visualization.  The central guidepin was drilled with the 10 degree augment posteriorly.  On preoperative imaging, the patient had retroversion of the glenoid, and this was partially corrected.  The glenoid was reamed in order to remove the cartilaginous surface without taking away too much bone.  The hole for the central post was drilled. The baseplate was impacted in a press-fit fashion. The central screw was placed.  Locking peripheral screws were drilled, measured, and placed.  Peripheral reamer was used to clear out any debris and allow for appropriate glenosphere placement.  Glenosphere was impacted and found to be firmly seated.  Glenosphere screw was tightened.   We then turned our attention to the humerus.  The humeral canal was sounded and sized appropriately until excellent diaphyseal fit was achieved.  Broaching was performed at ~20 degrees of retroversion.  Standard body with various sizes of the poly was trialed.  The joint was reduced and noted to have satisfactory stability, motion, and deltoid tension with adequate tuberosity reduction.  Trial implants were removed.  We drilled two holes into the shaft on either side of the bicipital groove ~2cm inferior to the humeral fracture line and passed two Xbraid tapes through both holes to serve as vertical tuberosity fixation sutures.  The previously passed Xbraid sutures in the greater tuberosity were passed through the lateral suture holes in the stem.  The stem was then inserted into the appropriate position.   Excellent press-fit was achieved.  Poly was trialed and above noted poly size was placed.   Next, #5 Ethibond sutures from the greater tuberosity were passed around the lesser tuberosity to serve as horizontal cerclage sutures.  One of the Xbraid tapes previously passed through the implant was then passed around the lesser tuberosity as well to serve as an around the world suture (greater tuberosity to stem to the lesser tuberosity).  Both vertical sutures were passed through the greater and lesser tuberosities.  Bone graft was placed between the tuberosities and the implant to help stimulate bone healing.  The humerus was externally rotated. The greater tuberosity to implant XBraid suture was tied first, securing the greater tuberosity to the implant.  Next, the horizontal cerclage Ethibond sutures were tied, and this was followed by the around the world XBraid suture. Finally, vertical sutures were tied. This achieved excellent stability of the tuberosities and they were stable to external and internal rotation.   Final confirmation of motion, tension, and stability were satisfactory. The wound was thoroughly irrigated. Vancomycin  powder was placed.  A Hemovac drain was placed.    The deltopectoral interval was closed with a running, 0-Vicryl suture. The skin was closed with 2-0 Vicryl and staples. Xeroform and Honeycomb dressing were applied. A PolarCare unit and sling were placed. Patient was extubated, transferred to a stretcher bed and to the post anesthesia care unit in stable condition.    Of note, assistance from a PA was essential to performing the surgery. PA assisted with patient positioning, exposure,  retraction, instrumentation, and wound closure. The surgery would have been more difficult and had longer operative time without PA assistance.   Additionally, this case had significantly increased complexity and surgical time compared to standard reverse shoulder arthroplasty.  This was due to  this being a displaced 3 part proximal humerus fracture.  This caused significantly distorted anatomy which required more careful and meticulous dissection, adding to surgical time.  Additionally, lesser and greater tuberosity repair required multiple sutures to be placed and tied.  Both of these factors added approximately 45 minutes to the surgical time compared to that for a standard reverse shoulder arthroplasty.   POSTOPERATIVE PLAN: Operative arm to remain in sling and abduction pillow with arm at neutral at all times except RoM exercises and hygiene. Can perform pendulums, elbow/wrist/hand RoM exercises. Passive RoM allowed to 90 FF and 30 ER. ASA 325mg  x 6 weeks for DVT ppx. Patient to return to clinic in ~2 weeks for post-operative appointment.

## 2024-06-17 NOTE — Evaluation (Signed)
 Physical Therapy Evaluation Patient Details Name: Elizabeth Bean MRN: [id number] DOB: 01/27/1946 Today's Date: 06/17/2024  History of Present Illness  Pt is a 78 y.o. female s/p L reverse total shoulder arthroplasty with biceps tenodesis (with interscalene brachial plexus block) 06/17/24 d/t L 3-part proximal humerus fx with head-splitting component.  PMH includes asthma, PNA, arthritis, osteoporosis, chronic cough.  Clinical Impression  Prior to surgery, pt reports being independent with ambulation; lives with her husband in 1 level home with 3 STE B railings.  0/10 pain reported throughout session.  Currently pt is CGA with transfer from recliner; CGA with ambulation 120 feet no AD use; and CGA navigating 4 steps with R UE support on railing.  No loss of balance noted during sessions activities.  SpO2 saturation concerns noted during session--pt normally does NOT use supplemental O2. At rest (prior to activity) pt's SpO2 sats were 88% on room air. Pt was able to perform some pursed lip breathing and able to increase SpO2 sats to 92%. After ambulation and stairs pt's SpO2 sats decreased to 83% on room air. Pt again performed pursed lip breathing and able to bring SpO2 sats back up to 92%.  Pt's family reports pt's oxygen is usually in the 90's but has h/o COPD.  Nursing and MD Tobie notified of above noted oxygen/SpO2 saturation concerns.  Pt and pt's family educated on home safety, fall prevention, and safe car transfers: pt and pt's family appearing with appropriate understanding.  Pt would currently benefit from skilled PT to address noted impairments and functional limitations (see below for any additional details).  Upon hospital discharge, pt would benefit from ongoing therapy (pt's family reports pt set up for OP PT).     If plan is discharge home, recommend the following: A little help with walking and/or transfers;A little help with bathing/dressing/bathroom;Assistance with  cooking/housework;Assist for transportation;Help with stairs or ramp for entrance   Can travel by private vehicle        Equipment Recommendations None recommended by PT  Recommendations for Other Services       Functional Status Assessment Patient has had a recent decline in their functional status and demonstrates the ability to make significant improvements in function in a reasonable and predictable amount of time.     Precautions / Restrictions Precautions Precautions: Fall;Shoulder Shoulder Interventions: Don joy ultra sling;Shoulder abduction pillow;At all times;Off for dressing/bathing/exercises Precaution Booklet Issued: Yes (comment) Recall of Precautions/Restrictions: Intact Restrictions Weight Bearing Restrictions Per Provider Order: Yes LUE Weight Bearing Per Provider Order: Non weight bearing      Mobility  Bed Mobility               General bed mobility comments: Deferred (pt in recliner beginning/end of session; no bed in pt's room)    Transfers Overall transfer level: Needs assistance Equipment used: None Transfers: Sit to/from Stand Sit to Stand: Contact guard assist           General transfer comment: steady transfer from recliner; mild increased effort/time to stand on own    Ambulation/Gait Ambulation/Gait assistance: Contact guard assist Gait Distance (Feet): 120 Feet Assistive device: None Gait Pattern/deviations: Step-through pattern, Decreased step length - right, Decreased step length - left       General Gait Details: Mild B lateral sway but steady; initial vc's to slow down.  Pt's son reports this is pt's typical ambulation.  Stairs Stairs: Yes Stairs assistance: Contact guard assist Stair Management: One rail Right, One rail Left, Step to  pattern, Forwards Number of Stairs: 4 General stair comments: Ascended/descended 4 steps with R UE support on railing; steady and safe  Wheelchair Mobility     Tilt Bed    Modified  Rankin (Stroke Patients Only)       Balance Overall balance assessment: Needs assistance Sitting-balance support: No upper extremity supported, Feet supported Sitting balance-Leahy Scale: Good Sitting balance - Comments: Steady reaching within BOS with R UE   Standing balance support: No upper extremity supported Standing balance-Leahy Scale: Good Standing balance comment: Steady ambulating with no UE support                             Pertinent Vitals/Pain Pain Assessment Pain Assessment: No/denies pain HR 80's to 90's bpm during session.    Home Living Family/patient expects to be discharged to:: Private residence Living Arrangements: Spouse/significant other Available Help at Discharge: Available 24 hours/day Type of Home: House Home Access: Stairs to enter Entrance Stairs-Rails: Doctor, general practice of Steps: 3   Home Layout: One level Home Equipment: Agricultural consultant (2 wheels);Rollator (4 wheels);Cane - single point;Shower seat;BSC/3in1      Prior Function Prior Level of Function : Independent/Modified Independent             Mobility Comments: 1 recent fall causing L shoulder injury; no other recent falls reported.  Independent with ambulation.       Extremity/Trunk Assessment   Upper Extremity Assessment Upper Extremity Assessment: Defer to OT evaluation LUE Deficits / Details: L UE not tested secondary to surgical location and NWB in sling    Lower Extremity Assessment Lower Extremity Assessment: Overall WFL for tasks assessed       Communication   Communication Communication: Impaired Factors Affecting Communication: Hearing impaired    Cognition Arousal: Alert Behavior During Therapy: WFL for tasks assessed/performed   PT - Cognitive impairments: No apparent impairments                         Following commands: Intact       Cueing Cueing Techniques: Verbal cues     General Comments General  comments (skin integrity, edema, etc.): L UE in sling/immobilizer.  Nursing cleared pt for participation in physical therapy.  Pt agreeable to PT session.  3 family members (including pt's husband and son) present during session and educated on SpO2 concerns (as well as pt) and that MD and nursing was notified.    Exercises     Assessment/Plan    PT Assessment Patient needs continued PT services  PT Problem List Decreased strength;Decreased range of motion;Decreased mobility;Decreased knowledge of precautions;Cardiopulmonary status limiting activity;Impaired sensation;Decreased skin integrity;Pain       PT Treatment Interventions DME instruction;Gait training;Stair training;Functional mobility training;Therapeutic activities;Therapeutic exercise;Balance training;Patient/family education    PT Goals (Current goals can be found in the Care Plan section)  Acute Rehab PT Goals Patient Stated Goal: to improve pain and use of L UE PT Goal Formulation: With patient Time For Goal Achievement: 07/01/24 Potential to Achieve Goals: Good    Frequency BID     Co-evaluation               AM-PAC PT 6 Clicks Mobility  Outcome Measure Help needed turning from your back to your side while in a flat bed without using bedrails?: A Little Help needed moving from lying on your back to sitting on the side of a flat bed  without using bedrails?: A Little Help needed moving to and from a bed to a chair (including a wheelchair)?: A Little Help needed standing up from a chair using your arms (e.g., wheelchair or bedside chair)?: A Little Help needed to walk in hospital room?: A Little Help needed climbing 3-5 steps with a railing? : A Little 6 Click Score: 18    End of Session Equipment Utilized During Treatment: Gait belt;Other (comment) (L UE sling/immobilizer) Activity Tolerance: Patient tolerated treatment well;Other (comment) (SpO2 desaturation noted during session) Patient left: in  chair;with family/visitor present;Other (comment) (polar care in place and activated; 3 family members present) Nurse Communication: Mobility status;Precautions;Weight bearing status;Other (comment) (Pt's SpO2 concerns) PT Visit Diagnosis: Other abnormalities of gait and mobility (R26.89);History of falling (Z91.81);Pain Pain - Right/Left: Left Pain - part of body: Shoulder    Time: 8291-8264 PT Time Calculation (min) (ACUTE ONLY): 27 min   Charges:   PT Evaluation $PT Eval Low Complexity: 1 Low PT Treatments $Therapeutic Activity: 8-22 mins PT General Charges $$ ACUTE PT VISIT: 1 Visit        Damien Caulk, PT 06/17/24, 6:12 PM

## 2024-06-17 NOTE — Anesthesia Postprocedure Evaluation (Signed)
 Anesthesia Post Note  Patient: Elizabeth Bean  Procedure(s) Performed: ARTHROPLASTY, SHOULDER, TOTAL, REVERSE (Left: Shoulder) TENODESIS, BICEPS (Left: Shoulder)  Patient location during evaluation: PACU Anesthesia Type: General Level of consciousness: awake and alert Pain management: pain level controlled Vital Signs Assessment: post-procedure vital signs reviewed and stable Respiratory status: spontaneous breathing, nonlabored ventilation, respiratory function stable and patient connected to nasal cannula oxygen Cardiovascular status: blood pressure returned to baseline and stable Postop Assessment: no apparent nausea or vomiting Anesthetic complications: no   No notable events documented.   Last Vitals:  Vitals:   06/17/24 1400 06/17/24 1415  BP: (!) 119/56 113/65  Pulse: 70 78  Resp: 14 17  Temp:    SpO2: 94% 94%    Last Pain:  Vitals:   06/17/24 1415  TempSrc:   PainSc: 0-No pain                 Debby Mines

## 2024-06-17 NOTE — H&P (Signed)
 Paper H&P to be scanned into permanent record. H&P reviewed. No significant changes noted.

## 2024-06-17 NOTE — Transfer of Care (Signed)
 Immediate Anesthesia Transfer of Care Note  Patient: Elizabeth Bean  Procedure(s) Performed: ARTHROPLASTY, SHOULDER, TOTAL, REVERSE (Left: Shoulder) TENODESIS, BICEPS (Left: Shoulder)  Patient Location: PACU  Anesthesia Type:General  Level of Consciousness: awake and alert   Airway & Oxygen Therapy: Patient Spontanous Breathing and Patient connected to face mask oxygen  Post-op Assessment: Report given to RN and Post -op Vital signs reviewed and stable  Post vital signs: Reviewed and stable  Last Vitals: Normal temp.  See PACU flow sheet.   Vitals Value Taken Time  BP 151/55 06/17/24 13:45  Temp    Pulse 71 06/17/24 13:50  Resp 16 06/17/24 13:50  SpO2 100 % 06/17/24 13:50  Vitals shown include unfiled device data.  Last Pain: Pt denies any pain.  Vitals:   06/17/24 0916  TempSrc: Temporal  PainSc: 7          Complications: No notable events documented.

## 2024-06-17 NOTE — Anesthesia Procedure Notes (Signed)
 Procedure Name: Intubation Date/Time: 06/17/2024 10:11 AM  Performed by: Jarvis Lew, CRNAPre-anesthesia Checklist: Patient identified, Patient being monitored, Timeout performed, Emergency Drugs available and Suction available Patient Re-evaluated:Patient Re-evaluated prior to induction Oxygen Delivery Method: Circle system utilized Preoxygenation: Pre-oxygenation with 100% oxygen Induction Type: IV induction Ventilation: Mask ventilation without difficulty Laryngoscope Size: 3 and McGrath Grade View: Grade I Tube type: Oral Tube size: 7.0 mm Number of attempts: 1 Airway Equipment and Method: Stylet Placement Confirmation: ETT inserted through vocal cords under direct vision, positive ETCO2 and breath sounds checked- equal and bilateral Secured at: 22 (secured rt lip .) cm Tube secured with: Tape Dental Injury: Teeth and Oropharynx as per pre-operative assessment  Comments: Eyes taped closed prior to DL after induced.  Atraumatic easy intubation with 1 DL.  Adhesive goggles placed over taped closed eyes.  ETT secured with extra pink tape and Tegaderm dsgs to reinforce ETT.

## 2024-06-17 NOTE — Discharge Instructions (Addendum)
 Elizabeth Albee, MD  [facility]  Phone: [phone]  Fax: [phone]   Discharge Instructions after Reverse Shoulder Replacement    1. Activity/Sling: You are to be non-weight bearing on operative extremity. A sling/shoulder immobilizer has been provided for you. Only remove the sling to perform elbow, wrist, and hand RoM exercises and hygiene/dressing. Active reaching and lifting are not permitted. You will be given further instructions on sling use at your first physical therapy visit and postoperative visit with Dr. Allena Katz.   2. Dressings: Dressing may be removed at 1st physical therapy visit (~3-4 days after surgery). Afterwards, you may either leave open to air (if no drainage) or cover with dry, sterile dressing. If you have steri-strips on your wound, please do not remove them. They will fall off on their own. You may shower 5 days after surgery. Please pat incision dry. Do not rub or place any shear forces across incision. If there is drainage or any opening of incision after 5 days, please notify our offices immediately.    3. Driving:  Plan on not driving for six weeks. Please note that you are advised NOT to drive while taking narcotic pain medications as you may be impaired and unsafe to drive.   4. Medications:  - You have been provided a prescription for narcotic pain medicine (usually oxycodone). After surgery, take 1-2 narcotic tablets every 4 hours if needed for severe pain. Please start this as soon as you begin to start having pain (if you received a nerve block, start taking as soon as this wears off).  - A prescription for anti-nausea medication will be provided in case the narcotic medicine causes nausea - take 1 tablet every 6 hours only if nauseated.  - Take enteric coated aspirin 325 mg once daily for 6 weeks to prevent blood clots. Do not take aspirin if you have an aspirin sensitivity/allergy or asthma or are on an anticoagulant (blood thinner) already. If so, then  your home anticoagulant will be resume and managed - do not take aspirin. -Take tylenol 1000mg  (2 Extra strength or 3 regular strength tablets) every 8 hours for pain. This will reduce the amount of narcotic medication needed. May stop tylenol when you are having minimal pain. - Take a stool softener (Colace, Dulcolax or Senakot) if you are using narcotic pain medications to help with constipation that is associated with narcotic use. - DO NOT take ANY nonsteroidal anti-inflammatory pain medications: Advil, Motrin, Ibuprofen, Aleve, Naproxen, or Naprosyn.   If you are taking prescription medication for anxiety, depression, insomnia, muscle spasm, chronic pain, or for attention deficit disorder you are advised that you are at a higher risk of adverse effects with use of narcotics post-op, including narcotic addiction/dependence, depressed breathing, death. If you use non-prescribed substances: alcohol, marijuana, cocaine, heroin, methamphetamines, etc., you are at a higher risk of adverse effects with use of narcotics post-op, including narcotic addiction/dependence, depressed breathing, death. You are advised that taking > 50 morphine milligram equivalents (MME) of narcotic pain medication per day results in twice the risk of overdose or death. For your prescription provided: oxycodone 5 mg - taking more than 6 tablets per day after the first few days of surgery.   5. Physical Therapy: 1-2 times per week for ~12 weeks. Therapy typically starts on post operative Day 3 or 4. You have been provided an order for physical therapy. The therapist will provide home exercises. Please contact our offices if this appointment has not been scheduled.  6. Work: May do light duty/desk job in approximately 2 weeks when off of narcotics, pain is well-controlled, and swelling has decreased if able to function with one arm in sling. Full work may take 6 weeks if light motions and function of both arms is required.  Lifting jobs may require 12 weeks.   7. Post-Op Appointments: Your first post-op appointment will be with Dr. Allena Katz in approximately 2 weeks time.    If you find that they have not been scheduled please call the Orthopaedic Appointment front desk at [phone].                               Elizabeth Albee, MD [facility] Phone: [phone] Fax: [phone]   REVERSE SHOULDER ARTHROPLASTY REHAB GUIDELINES   These guidelines should be tailored to individual patients based on their rehab goals, age, precautions, quality of repair, etc.  Progression should be based on patient progress and approval by the referring physician.  PHASE 1 - Day 1 through Week 2  GENERAL GUIDELINES AND PRECAUTIONS Sling wear 24/7 except during grooming and home exercises (3 to 5 times daily) Avoid shoulder extension such that the arm is posterior the frontal plane.  When patients recline, a pillow should be placed behind the upper arm and sling should be on.  They should be advised to always be able to see the elbow Avoid combined IR/ADD/EXT, such as hand behind back to prevent dislocation Avoid combined IR and ADD such as reaching across the chest to prevent dislocation No AROM No submersion in pool/water for 4 weeks No weight bearing through operative arm (as in transfers, walker use, etc.)  GOALS Maintain integrity of joint replacement; protect soft tissue healing Increase PROM for elevation to 120 and ER to 30 (will remain the goal for first 6 weeks) Optimize distal UE circulation and muscle activity (elbow, wrist and hand) Instruct in use of sling for proper fit, polar care device for ice application after HEP, signs/symptoms of infection  EXERCISES Active elbow, wrist and hand Passive forward elevation in scapular plane to 90-120 max motion; ER in scapular plane to 30 Active scapular retraction with arms resting in neutral position  CRITERIA TO PROGRESS TO  PHASE 2 Low pain (less than 3/10) with shoulder PROM Healing of incision without signs of infection Clearance by MD to advance after 2 week MD check up  PHASE 2 - 2 weeks - 6 weeks  GENERAL GUIDELINES AND PRECAUTIONS Sling may be removed while at home; worn in community without abduction pillow May use arm for light activities of daily living (such as feeding, brushing teeth, dressing.) with elbow near  the side of the body  and arm in front of the body- no active lifting of the arm May submerge in water (tub, pool, Pelzer, etc.) after 4 weeks Continue to avoid WBing through the operative arm Continue to avoid combined IR/EXT/ADD (hand behind the back) and IR/ADD  (reaching across chest) for dislocation precautions  GOALS  Achieve passive elevation to 120 and ER to 30  Low (less than 3/10) to no pain  Ability to fire all heads of the deltoid  EXERCISES May discontinue grip, and active elbow and wrist exercises since using the arm in ADL's  with sling removed around the home Continue passive elevation to 120 and ER to 30, both in scapular plane with arm supported on table top Add submaximal isometrics, pain free effort, for all  functional heads of deltoid (anterior, posterior, middle)  Ensure that with posterior deltoid isometric the shoulder does not move into extension and the arm remains anterior the frontal plane At 4 weeks:  begin to place arm in balanced position of 90 deg elevation in supine; when patient able to hold this position with ease, may begin reverse pendulums clockwise and counterclockwise  CRITERIA TO PROGRESS TO PHASE 3 Passive forward elevation in scapular plane to 120; passive ER in scapular plane to 30 Ability to fire isometrically all heads of the deltoid muscle without pain Ability to place and hold the arm in balanced position (90 deg elevation in supine)  PHASE 3 - 6 weeks to 3 months  GENERAL GUIDELINES AND PRECAUTIONS Discontinue use of sling Avoid  forcing end range motion in any direction to prevent dislocation  May advance use of the arm actively in ADL's without being restricted to arm by the side of the body, however, avoid heavy lifting and sports (forever!) May initiate functional IR behind the back gently NO UPPER BODY ERGOMETER   GOALS Optimize PROM for elevation and ER in scapular plane with realistic expectation that max  mobility for elevation is usually around 145-160 passively; ER 40 to 50 passively; functional IR to L1 Recover AROM to approach as close to PROM available as possible; may expect 135-150 deg active elevation; 30 deg active ER; active functional IR to L1 Establish dynamic stability of the shoulder with deltoid and periscapular muscle gradual strengthening  EXERCISES Forward elevation in scapular plane active progression: supine to incline, to vertical; short to long lever arm Balanced position long lever arm AROM Active ER/IR with arm at side Scapular retraction with light band resistance Functional IR with hand slide up back - very gentle and gradual NO UPPER BODY ERGOMETER     CRITERIA TO PROGRESS TO PHASE 4  AROM equals/approaches PROM with good mechanics for elevation   No pain  Higher level demand on shoulder than ADL functions   PHASE 4 12 months and beyond  GENERAL GUIDELINES AND PRECAUTIONS No heavy lifting and no overhead sports No heavy pushing activity Gradually increase strength of deltoid and scapular stabilizers; also the rotator cuff if present with weights not to exceed 5 lbs NO UPPER BODY ERGOMETER   GOALS  Optimize functional use of the operative UE to meet the desired demands  Gradual increase in deltoid, scapular muscle, and rotator cuff strength  Pain free functional activities   EXERCISES Add light hand weights for deltoid up to and not to exceed 3 lbs for anterior and posterior with long arm lift against gravity; elbow bent to 90 deg for abduction in scapular  plane Theraband progression for extension to hip with scapular depression/retraction Theraband progression for serratus anterior punches in supine; avoid wall, incline or prone pressups for serratus anterior End range stretching gently without forceful overpressure in all planes (elevation in scapular plane, ER in scapular plane, functional IR) with stretching done for life as part of a daily routine NO UPPER BODY ERGOMETER     CRITERIA FOR DISCHARGE FROM SKILLED PHYSICAL THERAPY  Pain free AROM for shoulder elevation (expect around 135-150)  Functional strength for all ADL's, work tasks, and hobbies approved by Careers adviser  Independence with home maintenance program   NOTES: 1. With proper exercise, motion, strength, and function continue to improve even after one year. 2. The complication rate after surgery is 5 - 8%. Complications include infection, fracture, heterotopic bone formation, nerve injury, instability, rotator cuff  tear, and tuberosity nonunion. Please look for clinical signs, unusual symptoms, or lack of progress with therapy and report those to Dr. Allena Katz. Prefer more communication than less.  3. The therapy plan above only serves as a guide. Please be aware of specific individualized patient instructions as written on the prescription or through discussions with the surgeon. 4. Please call Dr. Allena Katz if you have any specific questions or concerns [phone]   POLAR CARE INFORMATION  MassAdvertisement.it  How to use [facility] Polar [facility]?  YouTube   ShippingScam.co.uk  OPERATING INSTRUCTIONS  Start the product With dry hands, connect the transformer to the electrical connection located on the top of the cooler. Next, plug the transformer into an appropriate electrical outlet. The unit will automatically start running at this point.  To stop the pump, disconnect electrical power.  Unplug to stop the product when not in use. Unplugging  the Polar Care unit turns it off. Always unplug immediately after use. Never leave it plugged in while unattended. Remove pad.    FIRST ADD WATER TO FILL LINE, THEN ICE---Replace ice when existing ice is almost melted  1 Discuss Treatment with your Licensed Health Care Practitioner and Use Only as Prescribed 2 Apply Insulation Barrier & Cold Therapy Pad 3 Check for Moisture 4 Inspect Skin Regularly  Tips and Trouble Shooting Usage Tips 1. Use cubed or chunked ice for optimal performance. 2. It is recommended to drain the Pad between uses. To drain the pad, hold the Pad upright with the hose pointed toward the ground. Depress the black plunger and allow water to drain out. 3. You may disconnect the Pad from the unit without removing the pad from the affected area by depressing the silver tabs on the hose coupling and gently pulling the hoses apart. The Pad and unit will seal itself and will not leak. Note: Some dripping during release is normal. 4. DO NOT RUN PUMP WITHOUT WATER! The pump in this unit is designed to run with water. Running the unit without water will cause permanent damage to the pump. 5. Unplug unit before removing lid.  TROUBLESHOOTING GUIDE Pump not running, Water not flowing to the pad, Pad is not getting cold 1. Make sure the transformer is plugged into the wall outlet. 2. Confirm that the ice and water are filled to the indicated levels. 3. Make sure there are no kinks in the pad. 4. Gently pull on the blue tube to make sure the tube/pad junction is straight. 5. Remove the pad from the treatment site and ll it while the pad is lying at; then reapply. 6. Confirm that the pad couplings are securely attached to the unit. Listen for the double clicks (Figure 1) to confirm the pad couplings are securely attached.  Leaks    Note: Some condensation on the lines, controller, and pads is unavoidable, especially in warmer climates. 1. If using a [employer] Polar Care Cold Therapy unit  with a detachable Cold Therapy Pad, and a leak exists (other than condensation on the lines) disconnect the pad couplings. Make sure the silver tabs on the couplings are depressed before reconnecting the pad to the pump hose; then confirm both sides of the coupling are properly clicked in. 2. If the coupling continues to leak or a leak is detected in the pad itself, stop using it and call [employer] Customer Care at [phone].  Cleaning After use, empty and dry the unit with a soft cloth. Warm water and mild  detergent may be used occasionally to clean the pump and tubes.  WARNING: The Polar Care Cube can be cold enough to cause serious injury, including full skin necrosis. Follow these Operating Instructions, and carefully read the Product Insert (see pouch on side of unit) and the Cold Therapy Pad Fitting Instructions (provided with each Cold Therapy Pad) prior to use.       SHOULDER SLING IMMOBILIZER   VIDEO Slingshot 2 Shoulder Brace Application - YouTube ---https://www.porter.info/  INSTRUCTIONS While supporting the injured arm, slide the forearm into the sling. Wrap the adjustable shoulder strap around the neck and shoulders and attach the strap end to the sling using  the "alligator strap tab."  Adjust the shoulder strap to the required length. Position the shoulder pad behind the neck. To secure the shoulder pad location (optional), pull the shoulder strap away from the shoulder pad, unfold the hook material on the top of the pad, then press the shoulder strap back onto the hook material to secure the pad in place. Attach the closure strap across the open top of the sling. Position the strap so that it holds the arm securely in the sling. Next, attach the thumb strap to the open end of the sling between the thumb and fingers. After sling has been fit, it may be easily removed and reapplied using the quick release buckle on shoulder strap. If a neutral pillow or 15  abduction pillow is included, place the pillow at the waistline. Attach the sling to the pillow, lining up hook material on the pillow with the loop on sling. Adjust the waist strap to fit.  If waist strap is too long, cut it to fit. Use the small piece of double sided hook material (located on top of the pillow) to secure the strap end. Place the double sided hook material on the inside of the cut strap end and secure it to the waist strap.     If no pillow is included, attach the waist strap to the sling and adjust to fit.    Washing Instructions: Straps and sling must be removed and cleaned regularly depending on your activity level and perspiration. Hand wash straps and sling in cold water with mild detergent, rinse, air dry

## 2024-06-17 NOTE — Anesthesia Procedure Notes (Signed)
 Anesthesia Regional Block: Interscalene brachial plexus block   Pre-Anesthetic Checklist: , timeout performed,  Correct Patient, Correct Site, Correct Laterality,  Correct Procedure, Correct Position, site marked,  Risks and benefits discussed,  Surgical consent,  Pre-op evaluation,  At surgeon's request and post-op pain management  Laterality: Left  Prep: chloraprep       Needles:  Injection technique: Single-shot  Needle Type: Echogenic Needle     Needle Length: 4cm  Needle Gauge: 25     Additional Needles:   Procedures:,,,, ultrasound used (permanent image in chart),,    Narrative:  Start time: 06/17/2024 9:47 AM End time: 06/17/2024 9:49 AM Injection made incrementally with aspirations every 5 mL.  Performed by: Personally  Anesthesiologist: Leavy Ned, MD  Additional Notes: Patient's chart reviewed and they were deemed appropriate candidate for procedure, at surgeon's request. Patient educated about risks, benefits, and alternatives of the block including but not limited to: temporary or permanent nerve damage, bleeding, infection, damage to surround tissues, pneumothorax, hemidiaphragmatic paralysis, unilateral Horner's syndrome, block failure, local anesthetic toxicity. Patient expressed understanding. A formal time-out was conducted consistent with institution rules.  Monitors were applied, and minimal sedation used (see nursing record). The site was prepped with skin prep and allowed to dry, and sterile gloves were used. A high frequency linear ultrasound probe with probe cover was utilized throughout. C5-7 nerve roots located and appeared anatomically normal, local anesthetic injected around them, and echogenic block needle trajectory was monitored throughout. Aspiration performed every 5ml. Lung and blood vessels were avoided. All injections were performed without resistance and free of blood and paresthesias. The patient tolerated the procedure well.  Injectate: 20ml  exparel  + 10ml 0.5% bupivacaine 

## 2024-06-18 ENCOUNTER — Encounter: Payer: Self-pay | Admitting: Orthopedic Surgery

## 2024-08-15 ENCOUNTER — Other Ambulatory Visit: Payer: Self-pay | Admitting: Orthopedic Surgery

## 2024-08-15 DIAGNOSIS — S42202D Unspecified fracture of upper end of left humerus, subsequent encounter for fracture with routine healing: Secondary | ICD-10-CM

## 2024-08-21 ENCOUNTER — Ambulatory Visit
Admission: RE | Admit: 2024-08-21 | Discharge: 2024-08-21 | Disposition: A | Discharge status: Home / Self Care | Source: Ambulatory Visit | Attending: Orthopedic Surgery | Admitting: Orthopedic Surgery

## 2024-08-21 DIAGNOSIS — S42202D Unspecified fracture of upper end of left humerus, subsequent encounter for fracture with routine healing: Secondary | ICD-10-CM | POA: Diagnosis present
